# Patient Record
Sex: Female | Born: 1975 | Race: Black or African American | Hispanic: No | Marital: Single
Health system: Midwestern US, Community
[De-identification: ages and names within clinical notes are randomized; demographics above are authoritative.]

## PROBLEM LIST (undated history)

## (undated) DIAGNOSIS — F419 Anxiety disorder, unspecified: Secondary | ICD-10-CM

---

## 1998-09-26 ENCOUNTER — Emergency Department (HOSPITAL_COMMUNITY): Admission: EM | Admit: 1998-09-26 | Discharge: 1998-09-26 | Payer: Self-pay | Admitting: Emergency Medicine

## 1998-09-26 ENCOUNTER — Encounter: Payer: Self-pay | Admitting: Emergency Medicine

## 2015-07-28 ENCOUNTER — Emergency Department (HOSPITAL_COMMUNITY)
Admission: EM | Admit: 2015-07-28 | Discharge: 2015-07-28 | Disposition: A | Discharge status: Home / Self Care | Payer: Self-pay | Attending: Emergency Medicine | Admitting: Emergency Medicine

## 2015-07-28 ENCOUNTER — Encounter (HOSPITAL_COMMUNITY): Payer: Self-pay | Admitting: *Deleted

## 2015-07-28 ENCOUNTER — Emergency Department (HOSPITAL_COMMUNITY): Payer: Self-pay

## 2015-07-28 DIAGNOSIS — W01198A Fall on same level from slipping, tripping and stumbling with subsequent striking against other object, initial encounter: Secondary | ICD-10-CM | POA: Insufficient documentation

## 2015-07-28 DIAGNOSIS — M25512 Pain in left shoulder: Secondary | ICD-10-CM

## 2015-07-28 DIAGNOSIS — S8001XA Contusion of right knee, initial encounter: Secondary | ICD-10-CM | POA: Insufficient documentation

## 2015-07-28 DIAGNOSIS — Y9389 Activity, other specified: Secondary | ICD-10-CM | POA: Insufficient documentation

## 2015-07-28 DIAGNOSIS — Y999 Unspecified external cause status: Secondary | ICD-10-CM | POA: Insufficient documentation

## 2015-07-28 DIAGNOSIS — Y9289 Other specified places as the place of occurrence of the external cause: Secondary | ICD-10-CM | POA: Insufficient documentation

## 2015-07-28 DIAGNOSIS — S4992XA Unspecified injury of left shoulder and upper arm, initial encounter: Secondary | ICD-10-CM | POA: Insufficient documentation

## 2015-07-28 MED ORDER — HYDROCODONE-ACETAMINOPHEN 5-325 MG PO TABS
1.0000 | ORAL_TABLET | Freq: Once | ORAL | Status: AC
  Administered 2015-07-28: 1 via ORAL
  Filled 2015-07-28: qty 1

## 2015-07-28 MED ORDER — NAPROXEN 500 MG PO TABS: 500.0000 mg | ORAL_TABLET | Freq: Two times a day (BID) | ORAL | Status: DC

## 2015-07-28 NOTE — Discharge Instructions (Signed)
Shoulder Pain The shoulder is the joint that connects your arms to your body. The bones that form the shoulder joint include the upper arm bone (humerus), the shoulder blade (scapula), and the collarbone (clavicle). The top of the humerus is shaped like a ball and fits into a rather flat socket on the scapula (glenoid cavity). A combination of muscles and strong, fibrous tissues that connect muscles to bones (tendons) support your shoulder joint and hold the ball in the socket. Small, fluid-filled sacs (bursae) are located in different areas of the joint. They act as cushions between the bones and the overlying soft tissues and help reduce friction between the gliding tendons and the bone as you move your arm. Your shoulder joint allows a wide range of motion in your arm. This range of motion allows you to do things like scratch your back or throw a ball. However, this range of motion also makes your shoulder more prone to pain from overuse and injury. Causes of shoulder pain can originate from both injury and overuse and usually can be grouped in the following four categories:  Redness, swelling, and pain (inflammation) of the tendon (tendinitis) or the bursae (bursitis).  Instability, such as a dislocation of the joint.  Inflammation of the joint (arthritis).  Broken bone (fracture). HOME CARE INSTRUCTIONS   Apply ice to the sore area.  Put ice in a plastic bag.  Place a towel between your skin and the bag.  Leave the ice on for 15-20 minutes, 3-4 times per day for the first 2 days, or as directed by your health care provider.  Stop using cold packs if they do not help with the pain.  If you have a shoulder sling or immobilizer, wear it as long as your caregiver instructs. Only remove it to shower or bathe. Move your arm as little as possible, but keep your hand moving to prevent swelling.  Squeeze a soft ball or foam pad as much as possible to help prevent swelling.  Only take  over-the-counter or prescription medicines for pain, discomfort, or fever as directed by your caregiver. SEEK MEDICAL CARE IF:   Your shoulder pain increases, or new pain develops in your arm, hand, or fingers.  Your hand or fingers become cold and numb.  Your pain is not relieved with medicines. SEEK IMMEDIATE MEDICAL CARE IF:   Your arm, hand, or fingers are numb or tingling.  Your arm, hand, or fingers are significantly swollen or turn white or blue. MAKE SURE YOU:   Understand these instructions.  Will watch your condition.  Will get help right away if you are not doing well or get worse. Document Released: 07/25/2005 Document Revised: 03/01/2014 Document Reviewed: 09/29/2011 [facility] Patient Information 2015 [place], [place]. This information is not intended to replace advice given to you by your health care provider. Make sure you discuss any questions you have with your health care provider. Knee Pain The knee is the complex joint between your thigh and your lower leg. It is made up of bones, tendons, ligaments, and cartilage. The bones that make up the knee are:  The femur in the thigh.  The tibia and fibula in the lower leg.  The patella or kneecap riding in the groove on the lower femur. CAUSES  Knee pain is a common complaint with many causes. A few of these causes are:  Injury, such as:  A ruptured ligament or tendon injury.  Torn cartilage.  Medical conditions, such as:  Gout  Arthritis  Infections  Overuse, over training, or overdoing a physical activity. Knee pain can be minor or severe. Knee pain can accompany debilitating injury. Minor knee problems often respond well to self-care measures or get well on their own. More serious injuries may need medical intervention or even surgery. SYMPTOMS The knee is complex. Symptoms of knee problems can vary widely. Some of the problems are:  Pain with movement and weight bearing.  Swelling and  tenderness.  Buckling of the knee.  Inability to straighten or extend your knee.  Your knee locks and you cannot straighten it.  Warmth and redness with pain and fever.  Deformity or dislocation of the kneecap. DIAGNOSIS  Determining what is wrong may be very straight forward such as when there is an injury. It can also be challenging because of the complexity of the knee. Tests to make a diagnosis may include:  Your caregiver taking a history and doing a physical exam.  Routine X-rays can be used to rule out other problems. X-rays will not reveal a cartilage tear. Some injuries of the knee can be diagnosed by:  Arthroscopy a surgical technique by which a small video camera is inserted through tiny incisions on the sides of the knee. This procedure is used to examine and repair internal knee joint problems. Tiny instruments can be used during arthroscopy to repair the torn knee cartilage (meniscus).  Arthrography is a radiology technique. A contrast liquid is directly injected into the knee joint. Internal structures of the knee joint then become visible on X-ray film.  An MRI scan is a non X-ray radiology procedure in which magnetic fields and a computer produce two- or three-dimensional images of the inside of the knee. Cartilage tears are often visible using an MRI scanner. MRI scans have largely replaced arthrography in diagnosing cartilage tears of the knee.  Blood work.  Examination of the fluid that helps to lubricate the knee joint (synovial fluid). This is done by taking a sample out using a needle and a syringe. TREATMENT The treatment of knee problems depends on the cause. Some of these treatments are:  Depending on the injury, proper casting, splinting, surgery, or physical therapy care will be needed.  Give yourself adequate recovery time. Do not overuse your joints. If you begin to get sore during workout routines, back off. Slow down or do fewer repetitions.  For  repetitive activities such as cycling or running, maintain your strength and nutrition.  Alternate muscle groups. For example, if you are a weight lifter, work the upper body on one day and the lower body the next.  Either tight or weak muscles do not give the proper support for your knee. Tight or weak muscles do not absorb the stress placed on the knee joint. Keep the muscles surrounding the knee strong.  Take care of mechanical problems.  If you have flat feet, orthotics or special shoes may help. See your caregiver if you need help.  Arch supports, sometimes with wedges on the inner or outer aspect of the heel, can help. These can shift pressure away from the side of the knee most bothered by osteoarthritis.  A brace called an "unloader" brace also may be used to help ease the pressure on the most arthritic side of the knee.  If your caregiver has prescribed crutches, braces, wraps or ice, use as directed. The acronym for this is PRICE. This means protection, rest, ice, compression, and elevation.  Nonsteroidal anti-inflammatory drugs (NSAIDs), can help relieve  pain. But if taken immediately after an injury, they may actually increase swelling. Take NSAIDs with food in your stomach. Stop them if you develop stomach problems. Do not take these if you have a history of ulcers, stomach pain, or bleeding from the bowel. Do not take without your caregiver's approval if you have problems with fluid retention, heart failure, or kidney problems.  For ongoing knee problems, physical therapy may be helpful.  Glucosamine and chondroitin are over-the-counter dietary supplements. Both may help relieve the pain of osteoarthritis in the knee. These medicines are different from the usual anti-inflammatory drugs. Glucosamine may decrease the rate of cartilage destruction.  Injections of a corticosteroid drug into your knee joint may help reduce the symptoms of an arthritis flare-up. They may provide pain  relief that lasts a few months. You may have to wait a few months between injections. The injections do have a small increased risk of infection, water retention, and elevated blood sugar levels.  Hyaluronic acid injected into damaged joints may ease pain and provide lubrication. These injections may work by reducing inflammation. A series of shots may give relief for as long as 6 months.  Topical painkillers. Applying certain ointments to your skin may help relieve the pain and stiffness of osteoarthritis. Ask your pharmacist for suggestions. Many over the-counter products are approved for temporary relief of arthritis pain.  In some countries, doctors often prescribe topical NSAIDs for relief of chronic conditions such as arthritis and tendinitis. A review of treatment with NSAID creams found that they worked as well as oral medications but without the serious side effects. PREVENTION  Maintain a healthy weight. Extra pounds put more strain on your joints.  Get strong, stay limber. Weak muscles are a common cause of knee injuries. Stretching is important. Include flexibility exercises in your workouts.  Be smart about exercise. If you have osteoarthritis, chronic knee pain or recurring injuries, you may need to change the way you exercise. This does not mean you have to stop being active. If your knees ache after jogging or playing basketball, consider switching to swimming, water aerobics, or other low-impact activities, at least for a few days a week. Sometimes limiting high-impact activities will provide relief.  Make sure your shoes fit well. Choose footwear that is right for your sport.  Protect your knees. Use the proper gear for knee-sensitive activities. Use kneepads when playing volleyball or laying carpet. Buckle your seat belt every time you drive. Most shattered kneecaps occur in car accidents.  Rest when you are tired. SEEK MEDICAL CARE IF:  You have knee pain that is continual  and does not seem to be getting better.  SEEK IMMEDIATE MEDICAL CARE IF:  Your knee joint feels hot to the touch and you have a high fever. MAKE SURE YOU:   Understand these instructions.  Will watch your condition.  Will get help right away if you are not doing well or get worse. Document Released: 08/12/2007 Document Revised: 01/07/2012 Document Reviewed: 08/12/2007 [facility] Patient Information 2015 [place], [place]. This information is not intended to replace advice given to you by your health care provider. Make sure you discuss any questions you have with your health care provider.

## 2015-07-28 NOTE — ED Notes (Signed)
Pt in stating she slipped in a store tonight and hit her right knee and shin and her left shoulder/neck area, denies hitting head or LOC, ambulatory to room, no distress noted

## 2015-07-28 NOTE — ED Provider Notes (Signed)
CSN: [id number]     Arrival date & time 07/28/15  2115 History  By signing my name below, I, Doreatha Martin, attest that this documentation has been prepared under the direction and in the presence of Felicie Morn, NP.  Electronically Signed: Doreatha Martin, ED Scribe. 07/28/2015. 9:45 PM.    Chief Complaint  Patient presents with  . Fall   Patient is a 39 y.o. female presenting with fall. The history is provided by the patient. No language interpreter was used.  Fall This is a new problem. The current episode started 3 to 5 hours ago. The problem occurs constantly. The problem has not changed since onset.The symptoms are aggravated by walking and standing. The symptoms are relieved by rest. She has tried nothing for the symptoms. The treatment provided no relief.    HPI Comments: Jamie Olson is a 39 y.o. female who presents to the Emergency Department complaining of a mechanical fall that occurred today after slipping in a puddle of water in a store and landing on her right knee and hitting her left shoulder. No head injury, no LOC. She states associated moderate right knee pain that radiates to the ankle, left shoulder pain, that radiates to the neck. Pt reports that pain is worsened with weight bearing, movement and relieved with rest. Pt is not currently followed by a PCP due to a recent move. She denies any other injuries.   History reviewed. No pertinent past medical history. History reviewed. No pertinent past surgical history. History reviewed. No pertinent family history. Social History  Substance Use Topics  . Smoking status: Never Smoker   . Smokeless tobacco: None  . Alcohol Use: None   OB History    No data available     Review of Systems  Musculoskeletal: Positive for arthralgias.  All other systems reviewed and are negative.  Allergies  Review of patient's allergies indicates no known allergies.  Home Medications   Prior to Admission medications   Not on File   BP  152/75 mmHg  Pulse 97  Temp(Src) 98.5 F (36.9 C) (Oral)  Resp 20  SpO2 100%  LMP 07/16/2015 Physical Exam  Constitutional: She is oriented to person, place, and time. She appears well-developed and well-nourished.  HENT:  Head: Normocephalic and atraumatic.  Eyes: Conjunctivae and EOM are normal. Pupils are equal, round, and reactive to light.  Neck: Normal range of motion. Neck supple.  Cardiovascular: Normal rate, regular rhythm and normal heart sounds.   Pulmonary/Chest: Effort normal and breath sounds normal. No respiratory distress.  Lungs CTA bilaterally.   Abdominal: Soft. She exhibits no distension. There is no tenderness.  Musculoskeletal: Normal range of motion. She exhibits tenderness.  Right knee tenderness. No joint instability. Left shoulder muscular pain into the lateral neck. No midpoint cervical tenderness. Equal grip strengths bilaterally.   Neurological: She is alert and oriented to person, place, and time.  Skin: Skin is warm and dry.  Psychiatric: She has a normal mood and affect. Her behavior is normal.  Nursing note and vitals reviewed.   ED Course  Procedures (including critical care time) DIAGNOSTIC STUDIES: Oxygen Saturation is 100% on RA, normal by my interpretation.    COORDINATION OF CARE: 9:42 PM Discussed treatment plan with pt at bedside and pt agreed to plan.   Labs Review Labs Reviewed - No data to display  Imaging Review Dg Knee Complete 4 Views Right  07/28/2015   CLINICAL DATA:  Fall with right knee pain. Initial encounter.  EXAM: RIGHT KNEE - COMPLETE 4+ VIEW  COMPARISON:  None.  FINDINGS: There is no evidence of fracture, dislocation, or joint effusion. There is no evidence of arthropathy or other focal bone abnormality. Soft tissues are unremarkable.  IMPRESSION: Negative.   Electronically Signed   By: Marnee Spring M.D.   On: 07/28/2015 22:13   I have personally reviewed and evaluated these images and lab results as part of my  medical decision-making.   EKG Interpretation None     Radiology results reviewed and shared with patient. MDM   Final diagnoses:  None    Fall. Right knee contusion. Left shoulder pain. Symptomatic care instructions provided. Return precautions discussed.  I personally performed the services described in this documentation, which was scribed in my presence. The recorded information has been reviewed and is accurate.   Felicie Morn, NP 07/28/15 2352  Courteney Randall An, MD 07/29/15 1610

## 2015-07-28 NOTE — ED Notes (Signed)
Pt stable, ambulatory, states understanding of discharge instructions 

## 2016-12-29 IMAGING — CR DG KNEE COMPLETE 4+V*R*
4 series · 4 of 4 positions shown · non-contrast
Comparison: None.

CLINICAL DATA: Fall with right knee pain. Initial encounter.

EXAM:
RIGHT KNEE - COMPLETE 4+ VIEW

[knee ap]
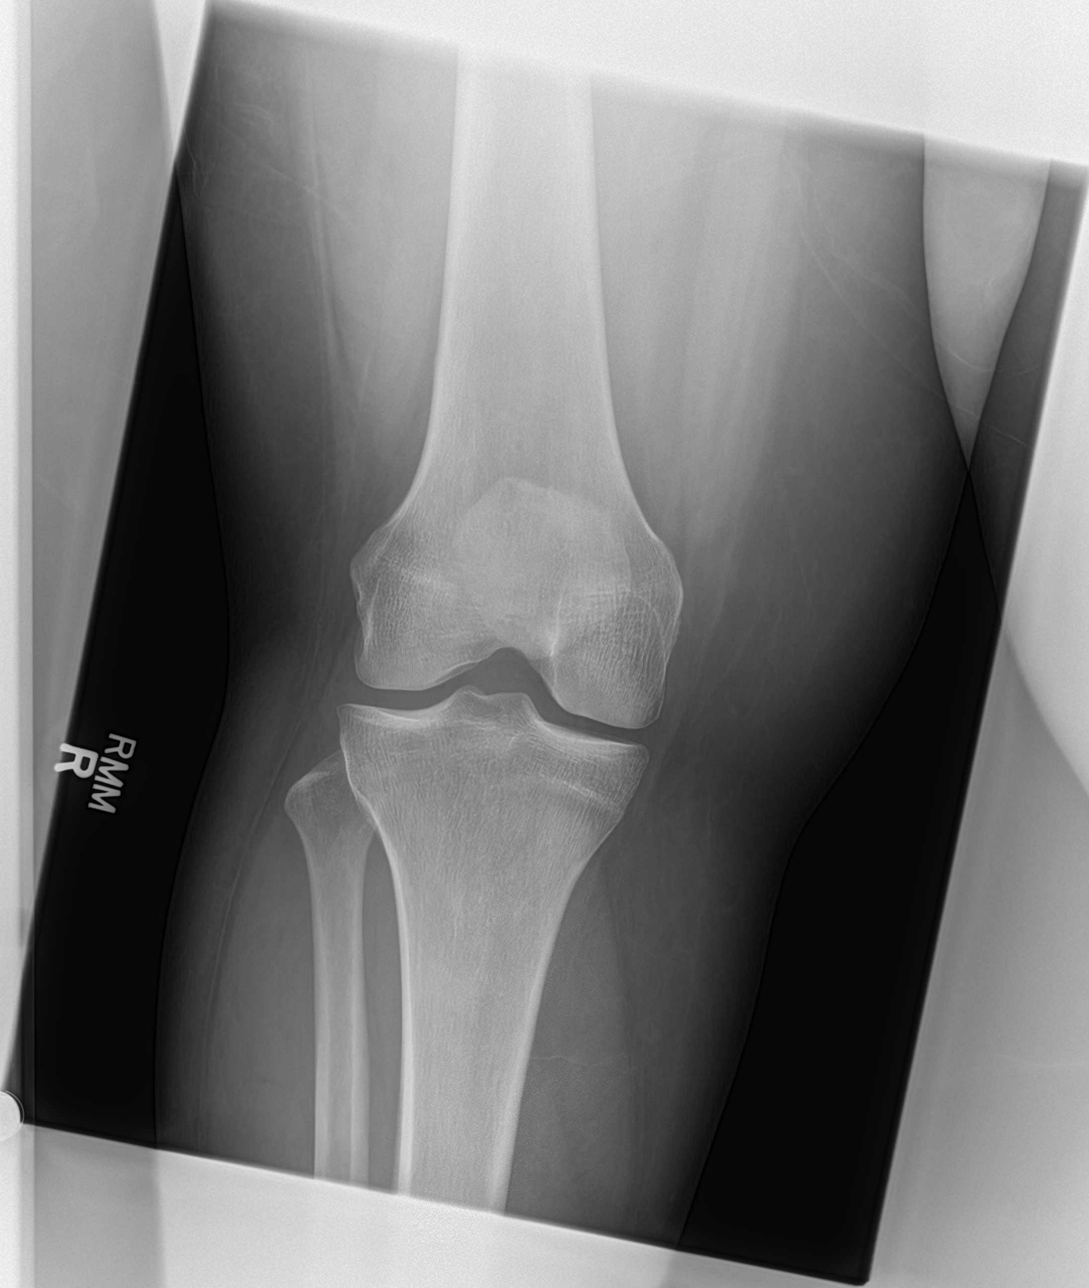

[tunnel]
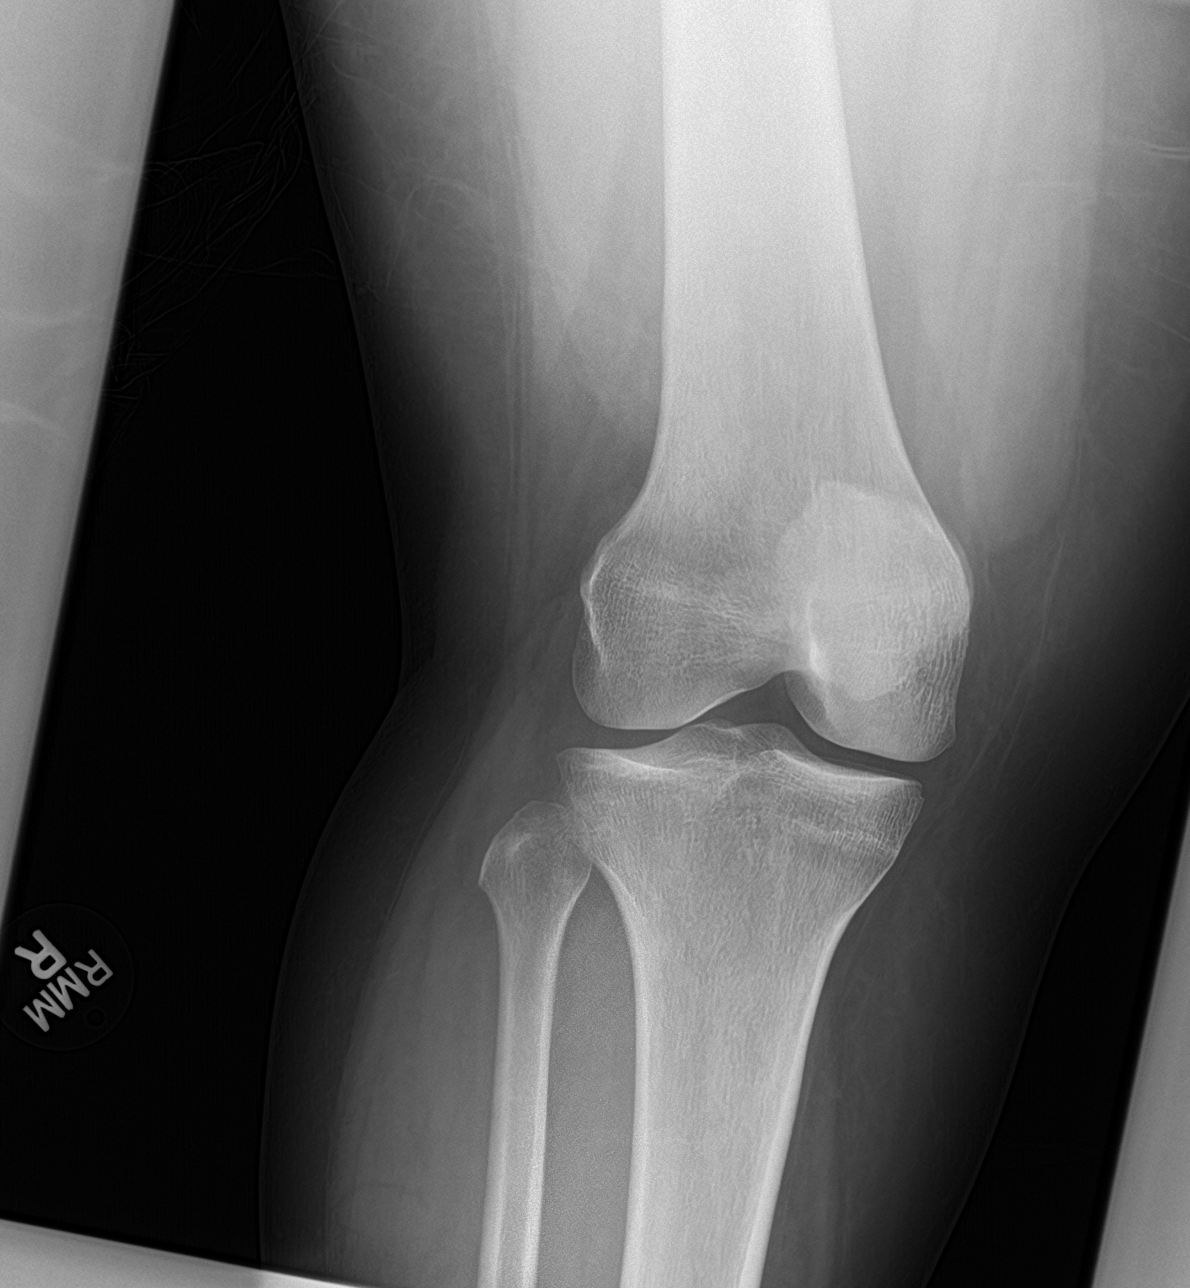

[knee lat]
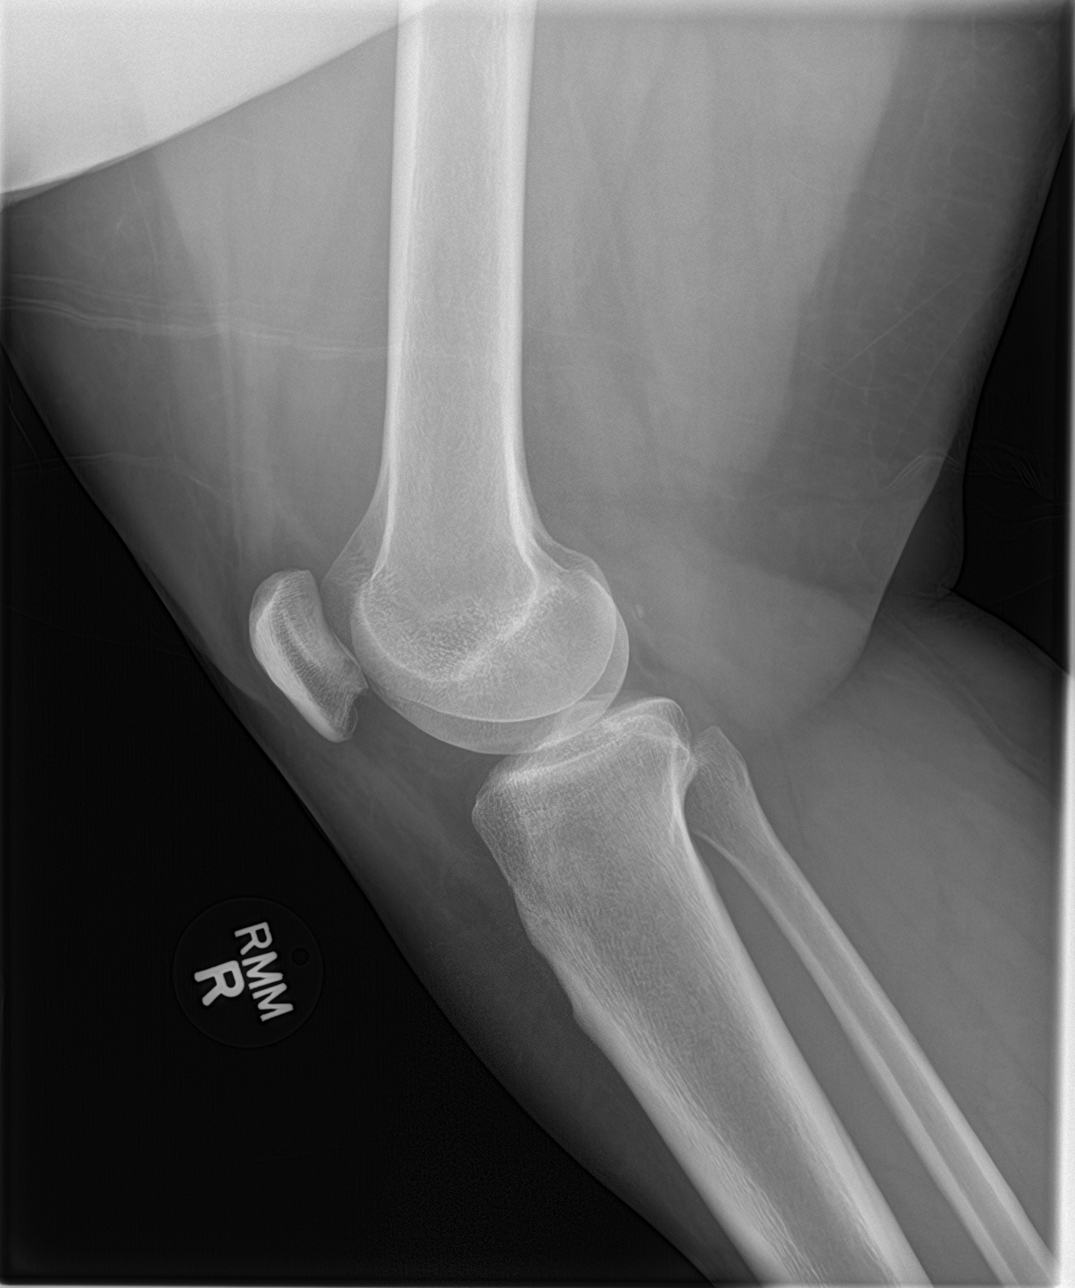

[knee obl]
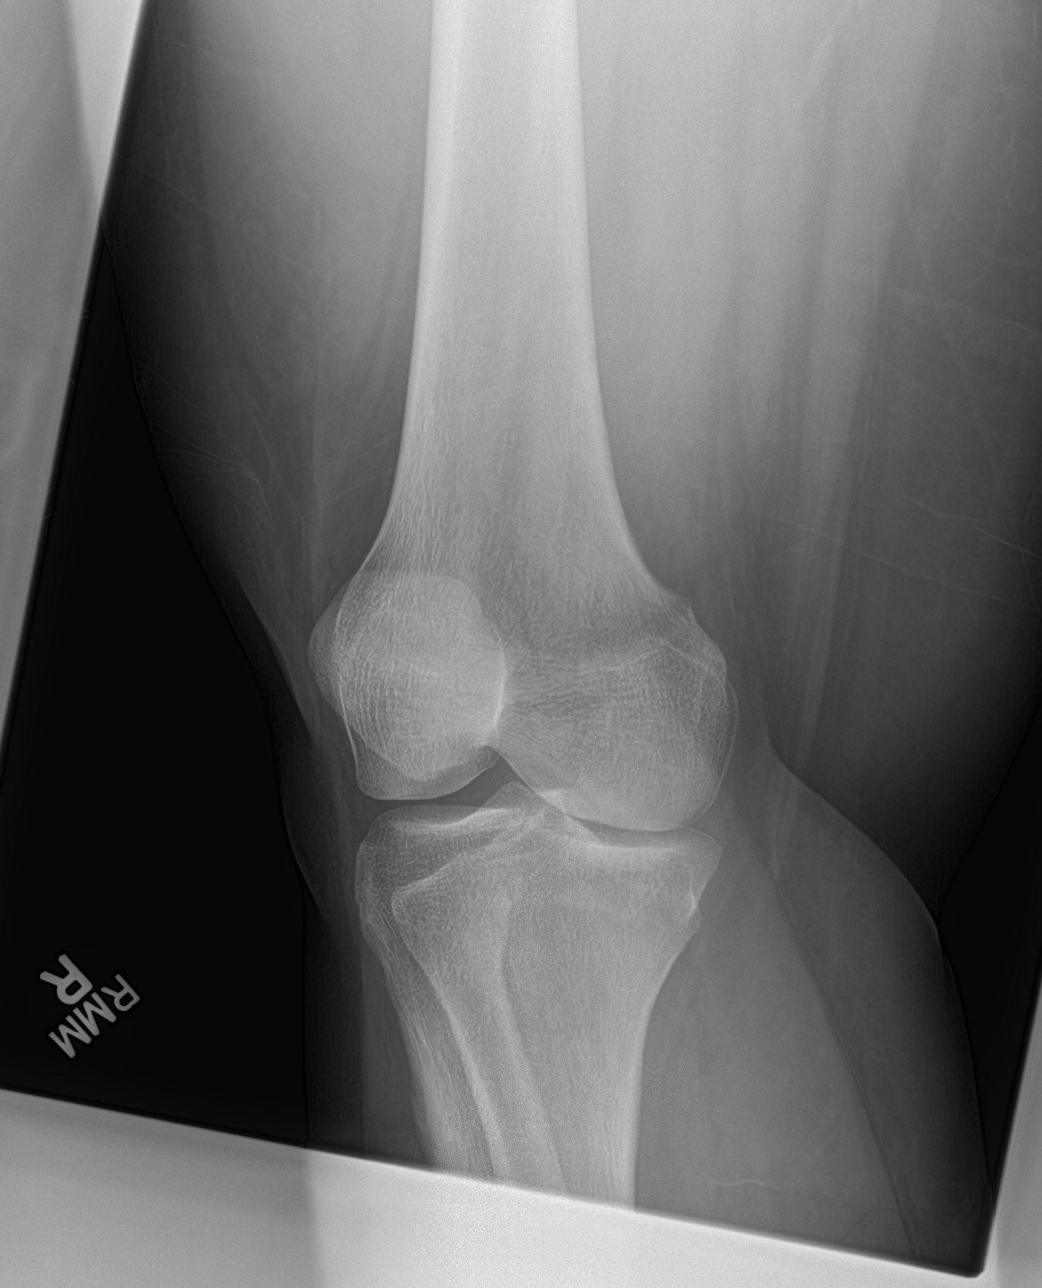

[4 of 4 positions shown; findings below may reference images not displayed]

FINDINGS: There is no evidence of fracture, dislocation, or joint effusion.
There is no evidence of arthropathy or other focal bone abnormality.
Soft tissues are unremarkable.
IMPRESSION: Negative.

## 2019-10-24 ENCOUNTER — Inpatient Hospital Stay
Admit: 2019-10-24 | Discharge: 2019-10-24 | Disposition: A | Discharge status: Home / Self Care | Payer: BLUE CROSS/BLUE SHIELD | Attending: Emergency Medicine

## 2019-10-24 DIAGNOSIS — M5431 Sciatica, right side: Secondary | ICD-10-CM

## 2019-10-24 MED ORDER — DEXAMETHASONE SODIUM PHOSPHATE 4 MG/ML IJ SOLN
4 mg/mL | INTRAMUSCULAR | Status: AC
Start: 2019-10-24 — End: 2019-10-24
  Administered 2019-10-24: 22:00:00 via INTRAMUSCULAR

## 2019-10-24 MED ORDER — CYCLOBENZAPRINE 10 MG TAB
10 mg | ORAL_TABLET | Freq: Three times a day (TID) | ORAL | 0 refills | Status: AC
Start: 2019-10-24 — End: 2021-09-24

## 2019-10-24 MED ORDER — DEXAMETHASONE SODIUM PHOSPHATE (PF) 10 MG/ML INJECTION
10 mg/mL | INTRAMUSCULAR | Status: AC
Start: 2019-10-24 — End: 2019-10-25

## 2019-10-24 MED ORDER — METHYLPREDNISOLONE 4 MG TABS IN A DOSE PACK
4 mg | ORAL | 0 refills | Status: AC
Start: 2019-10-24 — End: 2021-09-24

## 2019-10-24 MED FILL — DEXAMETHASONE SODIUM PHOSPHATE (PF) 10 MG/ML INJECTION: 10 mg/mL | INTRAMUSCULAR | Qty: 4

## 2019-10-24 NOTE — ED Provider Notes (Signed)
ED Provider Notes by Hetty BlendHutchison, Dabria Wadas, MD at 10/24/19 1704                Author: Hetty BlendHutchison, Auron Tadros, MD  Service: Emergency Medicine  Author Type: Physician       Filed: 10/24/19 1727  Date of Service: 10/24/19 1704  Status: Signed          Editor: Hetty BlendHutchison, Shalanda Brogden, MD (Physician)               EMERGENCY DEPARTMENT HISTORY AND PHYSICAL EXAM           Date: 10/24/2019   Patient Name: Leah Black        History of Presenting Illness          Chief Complaint       Patient presents with        ?  Back Pain        ?  Leg Pain           History Provided By: Patient      HPI: Preston Fleetingharlene Roberta Demaria , 43 y.o. female presents to the  ED with complaints of back and leg pain. Pt notes she has had lower back pain that radiates to her R leg that has worsened over the last week. Pt currently rates the pain a 7-8/10 that worsens with standing. Pt denies any injuries or trauma to her back  or leg. Pt denies any other sxs       There are no other complaints, changes, or physical findings at this time.      PCP: Alcario DroughtWinslow, Morgan Elizabeth, PA-C              Past History        Past Medical History:   History reviewed. No pertinent past medical history.      Past Surgical History:     Past Surgical History:         Procedure  Laterality  Date          ?  HX GI               Family History:   History reviewed. No pertinent family history.      Social History:     Social History          Tobacco Use         ?  Smoking status:  Never Smoker     ?  Smokeless tobacco:  Never Used       Substance Use Topics         ?  Alcohol use:  Yes             Comment: socially         ?  Drug use:  Never           Allergies:   Not on File           Review of Systems     Review of Systems    Musculoskeletal: Positive for back pain. Negative for arthralgias, joint swelling, myalgias, neck pain and neck stiffness.         R leg pain            Physical Exam     Physical Exam   Vitals signs and nursing note reviewed.    Constitutional:        General: She is awake.      Appearance: Normal appearance. She is well-developed and well-groomed. She is  obese.      Interventions:  Face mask in place.   HENT :       Head: Normocephalic and atraumatic.   Eyes :       Extraocular Movements: Extraocular movements intact.      Pupils: Pupils are equal, round, and reactive to light.    Neck:       Musculoskeletal: Full passive range of motion without pain, normal range of motion and neck supple.    Cardiovascular:       Rate and Rhythm: Normal rate and regular rhythm.      Heart sounds: Normal heart sounds.    Pulmonary:       Effort: Pulmonary effort is normal.      Breath sounds: Normal breath sounds and  air entry.   Abdominal:      General: Abdomen is flat.      Palpations: Abdomen is soft.     Musculoskeletal:      Lumbar back: She exhibits tenderness.      Comments:  Lower back tenderness, radiating to R leg consistent with sciatica.    Skin:      General: Skin is warm and dry.   Neurological :       General: No focal deficit present.      Mental Status: She is alert and oriented to person, place, and time.    Psychiatric:         Attention and Perception: Attention and perception normal.         Mood and Affect: Mood and affect normal.         Speech: Speech normal.         Behavior:  Behavior normal. Behavior is cooperative.         Thought Content: Thought content normal.         Cognition and Memory: Cognition and memory normal.         Judgment: Judgment normal.               Diagnostic Study Results        Labs -    No results found for this or any previous visit (from the past 12 hour(s)).      Radiologic Studies -      No orders to display          CT Results   (Last 48 hours)          None                 CXR Results   (Last 48 hours)          None                    Medical Decision Making and ED Course     I am the first provider for this patient.      I reviewed the vital signs, available nursing notes, past medical history,  past surgical history, family history and social history.      Vital Signs-Reviewed the patient's vital signs.   Patient Vitals for the past 12 hrs:           Temp  Pulse  Resp  BP           10/24/19 1704  98 ??F (36.7 ??C)  (!) 102  18  (!) 151/88              Records Reviewed: Nursing Notes  ED Course:    Initial assessment performed. The patients presenting problems have been discussed, and they are in agreement with the care plan formulated and outlined with them.  I have encouraged them to ask questions as they arise throughout their visit.      Provider Notes (Medical Decision Making):                  Disposition              DISCHARGE PLAN:   1. There are no discharge medications for this patient.      2.      Follow-up Information               Follow up With  Specialties  Details  Why  Contact Info              Eulis Canner, MD  Orthopedic Surgery    As needed  [address]   [address]   [place] [address]   [phone]                3.  Return to ED if worse         Diagnosis        Clinical Impression:       1.  Sciatica, right side            Attestations:      By signing my name below, I, Hedy Camara, attest that this documentation has been prepared under the direction and in presence of Dr. Will Bonnet  on 10/24/19. Electronically signed: Hedy Camara, 10/24/19 , 5:04 PM            Please note that this dictation was completed with Dragon, the computer voice recognition software.  Quite often unanticipated grammatical, syntax, homophones, and other interpretive errors are inadvertently  transcribed by the computer software.  Please disregard these errors.  Please excuse any errors that have escaped final proofreading.  Thank you.

## 2019-10-24 NOTE — Progress Notes (Signed)
GOING TO A DR IN [place] SHE DID NOT WISH TO MAKE AN APPT AT THIS TIME - ANL 10/26/2019

## 2019-10-24 NOTE — ED Notes (Signed)
C/o pin in right lower back that shoots down right leg [rogressively worse over the past few days

## 2019-10-24 NOTE — ED Triage Notes (Signed)
C/o pin in right lower back that shoots down right leg [rogressively worse over the past few days

## 2019-10-24 NOTE — Progress Notes (Signed)
GOING TO A DR IN [place] SHE DID NOT WISH TO MAKE AN APPT AT THIS TIME - ANL 10/26/2019

## 2019-10-24 NOTE — ED Provider Notes (Signed)
EMERGENCY DEPARTMENT HISTORY AND PHYSICAL EXAM      Date: 10/24/2019  Patient Name: Leah Black    History of Presenting Illness     Chief Complaint   Patient presents with   ??? Back Pain   ??? Leg Pain       History Provided By: Patient    HPI: Preston Fleeting, 43 y.o. female presents to the ED with complaints of back and leg pain. Pt notes she has had lower back pain that radiates to her R leg that has worsened over the last week. Pt currently rates the pain a 7-8/10 that worsens with standing. Pt denies any injuries or trauma to her back or leg. Pt denies any other sxs     There are no other complaints, changes, or physical findings at this time.    PCP: Alcario Drought, PA-C        Past History     Past Medical History:  History reviewed. No pertinent past medical history.    Past Surgical History:  Past Surgical History:   Procedure Laterality Date   ??? HX GI         Family History:  History reviewed. No pertinent family history.    Social History:  Social History     Tobacco Use   ??? Smoking status: Never Smoker   ??? Smokeless tobacco: Never Used   Substance Use Topics   ??? Alcohol use: Yes     Comment: socially   ??? Drug use: Never       Allergies:  Not on File      Review of Systems   Review of Systems   Musculoskeletal: Positive for back pain. Negative for arthralgias, joint swelling, myalgias, neck pain and neck stiffness.        R leg pain       Physical Exam   Physical Exam  Vitals signs and nursing note reviewed.   Constitutional:       General: She is awake.      Appearance: Normal appearance. She is well-developed and well-groomed. She is obese.      Interventions: Face mask in place.   HENT:      Head: Normocephalic and atraumatic.   Eyes:      Extraocular Movements: Extraocular movements intact.      Pupils: Pupils are equal, round, and reactive to light.   Neck:      Musculoskeletal: Full passive range of motion without pain, normal range of motion and neck supple.    Cardiovascular:      Rate and Rhythm: Normal rate and regular rhythm.      Heart sounds: Normal heart sounds.   Pulmonary:      Effort: Pulmonary effort is normal.      Breath sounds: Normal breath sounds and air entry.   Abdominal:      General: Abdomen is flat.      Palpations: Abdomen is soft.   Musculoskeletal:      Lumbar back: She exhibits tenderness.      Comments: Lower back tenderness, radiating to R leg consistent with sciatica.   Skin:     General: Skin is warm and dry.   Neurological:      General: No focal deficit present.      Mental Status: She is alert and oriented to person, place, and time.   Psychiatric:         Attention and Perception: Attention and perception normal.  Mood and Affect: Mood and affect normal.         Speech: Speech normal.         Behavior: Behavior normal. Behavior is cooperative.         Thought Content: Thought content normal.         Cognition and Memory: Cognition and memory normal.         Judgment: Judgment normal.         Diagnostic Study Results     Labs -   No results found for this or any previous visit (from the past 12 hour(s)).    Radiologic Studies -   No orders to display     CT Results  (Last 48 hours)    None        CXR Results  (Last 48 hours)    None          Medical Decision Making and ED Course   I am the first provider for this patient.    I reviewed the vital signs, available nursing notes, past medical history, past surgical history, family history and social history.    Vital Signs-Reviewed the patient's vital signs.  Patient Vitals for the past 12 hrs:   Temp Pulse Resp BP   10/24/19 1704 98 ??F (36.7 ??C) (!) 102 18 (!) 151/88         Records Reviewed: Nursing Notes      ED Course:   Initial assessment performed. The patients presenting problems have been discussed, and they are in agreement with the care plan formulated and outlined with them.  I have encouraged them to ask questions as they arise throughout their visit.     Provider Notes (Medical Decision Making):           Disposition         DISCHARGE PLAN:  1. There are no discharge medications for this patient.    2.   Follow-up Information     Follow up With Specialties Details Why Contact Info    Derry Skill, MD Orthopedic Surgery  As needed [address]  [address]  [place] [address]  [phone]          3.  Return to ED if worse     Diagnosis     Clinical Impression:   1. Sciatica, right side        Attestations:    By signing my name below, I, Steele Berg, attest that this documentation has been prepared under the direction and in presence of Dr. Ihor Dow on 10/24/19. Electronically signed: Steele Berg, 10/24/19, 5:04 PM        Please note that this dictation was completed with Dragon, the computer voice recognition software.  Quite often unanticipated grammatical, syntax, homophones, and other interpretive errors are inadvertently transcribed by the computer software.  Please disregard these errors.  Please excuse any errors that have escaped final proofreading.  Thank you.

## 2021-04-09 ENCOUNTER — Other Ambulatory Visit: Payer: Self-pay

## 2021-04-09 ENCOUNTER — Encounter (HOSPITAL_COMMUNITY): Payer: Self-pay | Admitting: Emergency Medicine

## 2021-04-09 ENCOUNTER — Ambulatory Visit (HOSPITAL_COMMUNITY)
Admission: EM | Admit: 2021-04-09 | Discharge: 2021-04-09 | Disposition: A | Discharge status: Home / Self Care | Payer: BC Managed Care – PPO | Attending: Urgent Care | Admitting: Urgent Care

## 2021-04-09 DIAGNOSIS — H1013 Acute atopic conjunctivitis, bilateral: Secondary | ICD-10-CM | POA: Insufficient documentation

## 2021-04-09 DIAGNOSIS — J029 Acute pharyngitis, unspecified: Secondary | ICD-10-CM | POA: Insufficient documentation

## 2021-04-09 DIAGNOSIS — R0789 Other chest pain: Secondary | ICD-10-CM | POA: Insufficient documentation

## 2021-04-09 DIAGNOSIS — B9789 Other viral agents as the cause of diseases classified elsewhere: Secondary | ICD-10-CM | POA: Insufficient documentation

## 2021-04-09 DIAGNOSIS — Z20822 Contact with and (suspected) exposure to covid-19: Secondary | ICD-10-CM | POA: Diagnosis not present

## 2021-04-09 DIAGNOSIS — J988 Other specified respiratory disorders: Secondary | ICD-10-CM | POA: Diagnosis not present

## 2021-04-09 MED ORDER — AZELASTINE HCL 0.05 % OP SOLN: 1.0000 [drp] | Freq: Two times a day (BID) | OPHTHALMIC | 0 refills | Status: DC

## 2021-04-09 MED ORDER — FLUTICASONE PROPIONATE 50 MCG/ACT NA SUSP: 2.0000 | Freq: Every day | NASAL | 0 refills | Status: DC

## 2021-04-09 MED ORDER — CETIRIZINE HCL 10 MG PO TABS: 10.0000 mg | ORAL_TABLET | Freq: Every day | ORAL | 0 refills | Status: DC

## 2021-04-09 NOTE — ED Provider Notes (Signed)
[facility]   MRN: [id number] DOB: 1976-01-10  Subjective:   Jamie Olson is a 45 y.o. female presenting for 1 week history of persistent right eye redness and drainage.  Has started to have a little bit more irritation in the past couple of days.  Has been wearing cosmetic eyelashes consistently.  She also had chest pain with a cough 3 days ago.  Still has throat pain.  Had multiple sick contacts.  Thinks he had RSV.  Would like to make sure she does not have COVID-19.  She is COVID vaccinated but no booster.  Denies any active cough, chest pain, shortness of breath, body aches, fevers.  No photophobia, eye trauma, contact lens wear.  Denies smoking cigarettes.  No history of respiratory disorders.  No current facility-administered medications for this encounter.  Current Outpatient Medications:    naproxen (NAPROSYN) 500 MG tablet, Take 1 tablet (500 mg total) by mouth 2 (two) times daily., Disp: 20 tablet, Rfl: 0   No Known Allergies  History reviewed. No pertinent past medical history.   History reviewed. No pertinent surgical history.  History reviewed. No pertinent family history.  Social History   Tobacco Use   Smoking status: Never   Smokeless tobacco: Never  Substance Use Topics   Alcohol use: Yes   Drug use: Never    ROS   Objective:   Vitals: BP (!) 160/109 (BP Location: Right Arm)   Pulse 99   Temp 99 F (37.2 C) (Oral)   Resp 17   LMP 03/08/2021   SpO2 96%   Physical Exam Constitutional:      General: She is not in acute distress.    Appearance: Normal appearance. She is well-developed. She is not ill-appearing, toxic-appearing or diaphoretic.  HENT:     Head: Normocephalic and atraumatic.     Right Ear: Tympanic membrane and ear canal normal. No drainage or tenderness. No middle ear effusion. Tympanic membrane is not erythematous.     Left Ear: Tympanic membrane and ear canal normal. No drainage or tenderness.  No middle ear  effusion. Tympanic membrane is not erythematous.     Nose: Nose normal. No congestion or rhinorrhea.     Mouth/Throat:     Mouth: Mucous membranes are moist. No oral lesions.     Pharynx: No pharyngeal swelling, oropharyngeal exudate, posterior oropharyngeal erythema or uvula swelling.     Tonsils: No tonsillar exudate or tonsillar abscesses.     Comments: Thick wall of white post-nasal drainage overlying pharynx.  Eyes:     General: No scleral icterus.       Right eye: No discharge.        Left eye: No discharge.     Extraocular Movements: Extraocular movements intact.     Right eye: Normal extraocular motion.     Left eye: Normal extraocular motion.     Pupils: Pupils are equal, round, and reactive to light.     Comments: Conjunctive a slightly injected bilaterally.  No photophobia.  Cardiovascular:     Rate and Rhythm: Normal rate and regular rhythm.     Pulses: Normal pulses.     Heart sounds: Normal heart sounds. No murmur heard.   No friction rub. No gallop.  Pulmonary:     Effort: Pulmonary effort is normal. No respiratory distress.     Breath sounds: Normal breath sounds. No stridor. No wheezing, rhonchi or rales.  Musculoskeletal:     Cervical back: Normal range of  motion and neck supple.  Lymphadenopathy:     Cervical: No cervical adenopathy.  Skin:    General: Skin is warm and dry.     Findings: No rash.  Neurological:     General: No focal deficit present.     Mental Status: She is alert and oriented to person, place, and time.  Psychiatric:        Mood and Affect: Mood normal.        Behavior: Behavior normal.        Thought Content: Thought content normal.     Assessment and Plan :   PDMP not reviewed this encounter.  1. Viral respiratory illness   2. Sore throat   3. Allergic conjunctivitis of both eyes   4. Atypical chest pain     Will manage for viral illness such as viral URI, viral syndrome, viral rhinitis, COVID-19. Counseled patient on nature of  COVID-19 including modes of transmission, diagnostic testing, management and supportive care.  Offered scripts for symptomatic relief. COVID 19 testing is pending.  Recommended avoidance of cosmetic eyelashes as a suspect she is having allergic conjunctivitis due to this.  Use azelastine eyedrops.  Counseled patient on potential for adverse effects with medications prescribed/recommended today, ER and return-to-clinic precautions discussed, patient verbalized understanding.     Wallis Bamberg, PA-C 04/09/21 1031

## 2021-04-09 NOTE — Discharge Instructions (Addendum)
We will notify you of your COVID-19 test results as they arrive and may take between 24 to 48 hours.  I encourage you to sign up for MyChart if you have not already done so as this can be the easiest way for [place] to communicate results to you online or through a phone app.  In the meantime, if you develop worsening symptoms including fever, chest pain, shortness of breath despite our current treatment plan then please report to the emergency room as this may be a sign of worsening status from possible COVID-19 infection.  Otherwise, we will manage this as a viral syndrome. For sore throat or cough try using a honey-based tea. Use 3 teaspoons of honey with juice squeezed from half lemon. Place shaved pieces of ginger into 1/2-1 cup of water and warm over stove top. Then mix the ingredients and repeat every 4 hours as needed. Please take Tylenol 500mg -650mg  every 6 hours for aches and pains, fevers. Hydrate very well with at least 2 liters of water. Eat light meals such as soups to replenish electrolytes and soft fruits, veggies. Start an antihistamine like Zyrtec for postnasal drainage, sinus congestion.  We'll also be using Flonase for the same symptoms.

## 2021-04-09 NOTE — ED Triage Notes (Signed)
Pt presents with sore throat xs 7 days and right eye redness, drainage and pain xs 2 days.

## 2021-04-10 LAB — SARS CORONAVIRUS 2 (TAT 6-24 HRS): SARS Coronavirus 2: NEGATIVE

## 2021-09-24 ENCOUNTER — Inpatient Hospital Stay
Admit: 2021-09-24 | Discharge: 2021-09-24 | Disposition: A | Discharge status: Home / Self Care | Payer: BLUE CROSS/BLUE SHIELD | Attending: Emergency Medicine

## 2021-09-24 DIAGNOSIS — J069 Acute upper respiratory infection, unspecified: Secondary | ICD-10-CM

## 2021-09-24 MED ORDER — BENZONATATE 100 MG CAP
100 mg | ORAL_CAPSULE | Freq: Three times a day (TID) | ORAL | 0 refills | Status: AC | PRN
Start: 2021-09-24 — End: 2021-10-01

## 2021-09-24 MED ORDER — IBUPROFEN 600 MG TAB
600 mg | ORAL_TABLET | Freq: Four times a day (QID) | ORAL | 0 refills | Status: AC | PRN
Start: 2021-09-24 — End: ?

## 2021-09-24 NOTE — ED Provider Notes (Signed)
Pt c/o cough/congestion x 2 days.  No fever. No pain. No n/v/d. No weakness. No back pain or urinary changes.   Cough mild, non prod.  Taking mucinex w mild relief.  Neg home covid test but was an old test per pt   No sore throat or diff swallwing. No sob.        History reviewed. No pertinent past medical history.    Past Surgical History:   Procedure Laterality Date    HX APPENDECTOMY      HX GI           History reviewed. No pertinent family history.    Social History     Socioeconomic History    Marital status: SINGLE     Spouse name: Not on file    Number of children: Not on file    Years of education: Not on file    Highest education level: Not on file   Occupational History    Not on file   Tobacco Use    Smoking status: Never    Smokeless tobacco: Never   Substance and Sexual Activity    Alcohol use: Yes     Comment: socially    Drug use: Never    Sexual activity: Not on file   Other Topics Concern    Not on file   Social History Narrative    Not on file     Social Determinants of Health     Financial Resource Strain: Not on file   Food Insecurity: Not on file   Transportation Needs: Not on file   Physical Activity: Not on file   Stress: Not on file   Social Connections: Not on file   Intimate Partner Violence: Not on file   Housing Stability: Not on file         ALLERGIES: Patient has no known allergies.    Review of Systems   Constitutional:  Negative for fever.   HENT:  Positive for congestion.    Respiratory:  Positive for cough. Negative for shortness of breath.    Cardiovascular:  Negative for chest pain.   Gastrointestinal:  Negative for abdominal pain and vomiting.   Musculoskeletal:  Negative for back pain.   Skin:  Negative for rash.   Neurological:  Negative for light-headedness.   All other systems reviewed and are negative.    Vitals:    09/24/21 1548   BP: (!) 154/97   Pulse: 96   Resp: 16   Temp: 98.5 ??F (36.9 ??C)   SpO2: 98%   Weight: 108.9 kg (240 lb)   Height: _0  (1.626 m)             Physical Exam  Vitals and nursing note reviewed.   Constitutional:       Appearance: She is well-developed. She is not diaphoretic.   HENT:      Head: Normocephalic and atraumatic.      Nose: Nose normal.   Eyes:      Pupils: Pupils are equal, round, and reactive to light.   Cardiovascular:      Rate and Rhythm: Normal rate and regular rhythm.      Heart sounds: No murmur heard.  Pulmonary:      Effort: Pulmonary effort is normal.      Breath sounds: No wheezing.   Abdominal:      Palpations: Abdomen is soft.      Tenderness: There is no abdominal tenderness.   Musculoskeletal:  General: No tenderness.      Cervical back: Normal range of motion.   Skin:     General: Skin is dry.      Capillary Refill: Capillary refill takes less than 2 seconds.      Findings: No rash.   Neurological:      Mental Status: She is alert and oriented to person, place, and time.   Psychiatric:         Mood and Affect: Mood normal.        MDM         Procedures    Vitals:  Patient Vitals for the past 12 hrs:   Temp Pulse Resp BP SpO2   09/24/21 1548 98.5 ??F (36.9 ??C) 96 16 (!) 154/97 98 %         Medications ordered:   Medications - No data to display      Lab findings:  No results found for this or any previous visit (from the past 12 hour(s)).        X-Ray, CT or other radiology findings or impressions:  No orders to display             Progress notes, Consult notes or additional Procedure notes:   Alert, non-toxic, lungs ctab, nl sats.   Not c/w hypoxia/sepsis/pna. No risks for inf tx.  No ind for flu testing.  Pt req work note, given. Dc w detailed ret inst given.       Diagnosis:   1. Upper respiratory tract infection, unspecified type        Disposition: home    Follow-up Information       Follow up With Specialties Details Why Contact Info    [facility] [place] EMERGENCY DEPT Emergency Medicine Go to  As needed, If symptoms worsen [place]    Arlana Lindau, MD Family Medicine Schedule an  appointment as soon as possible for a visit in 2 days  [place] [address]  [phone]      Rosalene Billings, NP Nurse Practitioner Schedule an appointment as soon as possible for a visit in 2 days  [place] [address]  [phone]               Patient's Medications   Start Taking    BENZONATATE (TESSALON PERLES) 100 MG CAPSULE    Take 1 Capsule by mouth three (3) times daily as needed for Cough for up to 7 days.    IBUPROFEN (MOTRIN) 600 MG TABLET    Take 1 Tablet by mouth every six (6) hours as needed for Pain.   Continue Taking    CETIRIZINE (ZYRTEC) 10 MG CAP    Take  by mouth.   These Medications have changed    No medications on file   Stop Taking    CYCLOBENZAPRINE (FLEXERIL) 10 MG TABLET    Take 1 Tab by mouth three (3) times daily (with meals).    METHYLPREDNISOLONE (MEDROL, PAK,) 4 MG TABLET    Take as directed.    OMEPRAZOLE (PRILOSEC) 20 MG CAPSULE    Take 20 mg by mouth.

## 2021-09-24 NOTE — ED Notes (Signed)
Patient c/o nasal congestion, cough, and chest congestion since Friday.

## 2021-09-24 NOTE — ED Notes (Signed)
Discharge instructions reviewed with patient.  Patient verbalized understanding.  Patient advised to follow up as directed on discharge instructions.  Patient denies questions, needs or concerns at this time.  Patient verbalized understanding. No s/sx of distress noted.

## 2023-05-29 ENCOUNTER — Encounter (HOSPITAL_COMMUNITY): Payer: Self-pay | Admitting: Emergency Medicine

## 2023-05-29 ENCOUNTER — Emergency Department (HOSPITAL_COMMUNITY)
Admission: EM | Admit: 2023-05-29 | Discharge: 2023-05-30 | Disposition: A | Discharge status: Home / Self Care | Payer: BC Managed Care – PPO | Source: Home / Self Care

## 2023-05-29 ENCOUNTER — Other Ambulatory Visit: Payer: Self-pay

## 2023-05-29 DIAGNOSIS — R45851 Suicidal ideations: Secondary | ICD-10-CM | POA: Insufficient documentation

## 2023-05-29 DIAGNOSIS — Z79899 Other long term (current) drug therapy: Secondary | ICD-10-CM | POA: Insufficient documentation

## 2023-05-29 DIAGNOSIS — F332 Major depressive disorder, recurrent severe without psychotic features: Secondary | ICD-10-CM | POA: Insufficient documentation

## 2023-05-29 DIAGNOSIS — F322 Major depressive disorder, single episode, severe without psychotic features: Secondary | ICD-10-CM | POA: Diagnosis not present

## 2023-05-29 HISTORY — DX: Anxiety disorder, unspecified: F41.9

## 2023-05-29 LAB — COMPREHENSIVE METABOLIC PANEL
ALT: 18 U/L (ref 0–44)
AST: 21 U/L (ref 15–41)
Albumin: 4.1 g/dL (ref 3.5–5.0)
Alkaline Phosphatase: 79 U/L (ref 38–126)
Anion gap: 10 (ref 5–15)
BUN: 9 mg/dL (ref 6–20)
CO2: 26 mmol/L (ref 22–32)
Calcium: 9.1 mg/dL (ref 8.9–10.3)
Chloride: 103 mmol/L (ref 98–111)
Creatinine, Ser: 0.91 mg/dL (ref 0.44–1.00)
GFR, Estimated: >60 mL/min (ref >60)
Glucose, Bld: 98 mg/dL (ref 70–99)
Potassium: 3.9 mmol/L (ref 3.5–5.1)
Sodium: 139 mmol/L (ref 135–145)
Total Bilirubin: 0.8 mg/dL (ref 0.3–1.2)
Total Protein: 7.9 g/dL (ref 6.5–8.1)

## 2023-05-29 LAB — CBC
HCT: 36.8 % (ref 36.0–46.0)
Hemoglobin: 12.1 g/dL (ref 12.0–15.0)
MCH: 30.6 pg (ref 26.0–34.0)
MCHC: 32.9 g/dL (ref 30.0–36.0)
MCV: 92.9 fL (ref 80.0–100.0)
Platelets: 292 10*3/uL (ref 150–400)
RBC: 3.96 MIL/uL (ref 3.87–5.11)
RDW: 13 % (ref 11.5–15.5)
WBC: 7.2 10*3/uL (ref 4.0–10.5)
nRBC: 0 % (ref 0.0–0.2)

## 2023-05-29 LAB — RAPID URINE DRUG SCREEN, HOSP PERFORMED
Amphetamines: NOT DETECTED
Barbiturates: NOT DETECTED
Benzodiazepines: NOT DETECTED
Cocaine: NOT DETECTED
Opiates: NOT DETECTED
Tetrahydrocannabinol: NOT DETECTED

## 2023-05-29 LAB — ETHANOL: Alcohol, Ethyl (B): 86 mg/dL — ABNORMAL HIGH (ref <10)

## 2023-05-29 LAB — SALICYLATE LEVEL: Salicylate Lvl: <7 mg/dL — ABNORMAL LOW (ref 7.0–30.0)

## 2023-05-29 LAB — ACETAMINOPHEN LEVEL: Acetaminophen (Tylenol), Serum: <10 ug/mL — ABNORMAL LOW (ref 10–30)

## 2023-05-29 LAB — POC URINE PREG, ED: Preg Test, Ur: NEGATIVE

## 2023-05-29 MED ORDER — ONDANSETRON HCL 4 MG PO TABS: 4.0000 mg | ORAL_TABLET | Freq: Three times a day (TID) | ORAL | Status: DC | PRN

## 2023-05-29 MED ORDER — ACETAMINOPHEN 325 MG PO TABS: 650.0000 mg | ORAL_TABLET | ORAL | Status: DC | PRN

## 2023-05-29 MED ORDER — ZOLPIDEM TARTRATE 5 MG PO TABS: 5.0000 mg | ORAL_TABLET | Freq: Every evening | ORAL | Status: DC | PRN

## 2023-05-29 NOTE — ED Triage Notes (Addendum)
Pt bib Daughter for SI. Pt states she found out today she had herpes. Per daughter pt has been having suicidal thoughts today . Daughter said pt said she was going to take pills and tried to jump out of the car on the way to the hospital. Pt states she was not going to take pills and didn't try to jump out of the car. Pt denies any SI history until today. She found out recently her long time boyfriend had been with someone who had herpes so she went to be tested. Pt is tearful in triage. Pt also complains of back pain. Pt states she thinks she needs help.

## 2023-05-29 NOTE — ED Notes (Signed)
Patient dressed in burgundy scrubs.

## 2023-05-29 NOTE — ED Provider Notes (Signed)
[place] EMERGENCY DEPARTMENT AT [facility] Provider Note   CSN: [id number] Arrival date & time: 05/29/23  2222     History  Chief Complaint  Patient presents with   Suicidal    Jamie Olson is a 47 y.o. female.  Patient presents to the emergency department for evaluation of depression with suicidal ideation.  Patient reports that she has multiple social stressors that have caused her to feel depressed.  She has had thoughts about harming herself by taking pills.  She reportedly tried to jump from a moving car on the way to the hospital today.       Home Medications Prior to Admission medications   Medication Sig Start Date End Date Taking? Authorizing Provider  azelastine (OPTIVAR) 0.05 % ophthalmic solution Place 1 drop into both eyes 2 (two) times daily. 04/09/21   Wallis Bamberg, PA-C  cetirizine (ZYRTEC ALLERGY) 10 MG tablet Take 1 tablet (10 mg total) by mouth daily. 04/09/21   Wallis Bamberg, PA-C  fluticasone (FLONASE) 50 MCG/ACT nasal spray Place 2 sprays into both nostrils daily. 04/09/21   Wallis Bamberg, PA-C  naproxen (NAPROSYN) 500 MG tablet Take 1 tablet (500 mg total) by mouth 2 (two) times daily. 07/28/15   Felicie Morn, NP      Allergies    Patient has no known allergies.    Review of Systems   Review of Systems  Physical Exam Updated Vital Signs BP (!) 158/104   Pulse (!) 120   Temp 98.9 F (37.2 C) (Oral)   Resp 17   Ht 5\' 4"  (1.626 m)   Wt 92.5 kg   SpO2 100%   BMI 35.02 kg/m  Physical Exam Vitals and nursing note reviewed.  Constitutional:      General: She is not in acute distress.    Appearance: She is well-developed.  HENT:     Head: Normocephalic and atraumatic.     Mouth/Throat:     Mouth: Mucous membranes are moist.  Eyes:     General: Vision grossly intact. Gaze aligned appropriately.     Extraocular Movements: Extraocular movements intact.     Conjunctiva/sclera: Conjunctivae normal.  Cardiovascular:     Rate and  Rhythm: Normal rate and regular rhythm.     Pulses: Normal pulses.     Heart sounds: Normal heart sounds, S1 normal and S2 normal. No murmur heard.    No friction rub. No gallop.  Pulmonary:     Effort: Pulmonary effort is normal. No respiratory distress.     Breath sounds: Normal breath sounds.  Abdominal:     General: Bowel sounds are normal.     Palpations: Abdomen is soft.     Tenderness: There is no abdominal tenderness. There is no guarding or rebound.     Hernia: No hernia is present.  Musculoskeletal:        General: No swelling.     Cervical back: Full passive range of motion without pain, normal range of motion and neck supple. No spinous process tenderness or muscular tenderness. Normal range of motion.     Right lower leg: No edema.     Left lower leg: No edema.  Skin:    General: Skin is warm and dry.     Capillary Refill: Capillary refill takes less than 2 seconds.     Findings: No ecchymosis, erythema, rash or wound.  Neurological:     General: No focal deficit present.     Mental Status: She is alert  and oriented to person, place, and time.     GCS: GCS eye subscore is 4. GCS verbal subscore is 5. GCS motor subscore is 6.     Cranial Nerves: Cranial nerves 2-12 are intact.     Sensory: Sensation is intact.     Motor: Motor function is intact.     Coordination: Coordination is intact.  Psychiatric:        Attention and Perception: She is inattentive.        Mood and Affect: Mood is depressed.        Speech: She is noncommunicative.        Behavior: Behavior is slowed and withdrawn.        Thought Content: Thought content includes suicidal ideation.     ED Results / Procedures / Treatments   Labs (all labs ordered are listed, but only abnormal results are displayed) Labs Reviewed  CBC  COMPREHENSIVE METABOLIC PANEL  ETHANOL  SALICYLATE LEVEL  ACETAMINOPHEN LEVEL  RAPID URINE DRUG SCREEN, HOSP PERFORMED  POC URINE PREG, ED    EKG None  Radiology No  results found.  Procedures Procedures    Medications Ordered in ED Medications  acetaminophen (TYLENOL) tablet 650 mg (has no administration in time range)  zolpidem (AMBIEN) tablet 5 mg (has no administration in time range)  ondansetron (ZOFRAN) tablet 4 mg (has no administration in time range)    ED Course/ Medical Decision Making/ A&P                                 Medical Decision Making Amount and/or Complexity of Data Reviewed Labs: ordered.  Risk OTC drugs. Prescription drug management.   Patient presents to the emergency department for evaluation of suicidal ideation secondary to multiple social stressors.  No history of depression.  Patient has no other past medical history, medically clear for psychiatric evaluation and treatment.        Final Clinical Impression(s) / ED Diagnoses Final diagnoses:  Suicidal ideation    Rx / DC Orders ED Discharge Orders     None         Fanchon Papania, Canary Brim, MD 05/29/23 2319

## 2023-05-30 ENCOUNTER — Encounter (HOSPITAL_COMMUNITY): Payer: Self-pay | Admitting: Nurse Practitioner

## 2023-05-30 ENCOUNTER — Inpatient Hospital Stay (HOSPITAL_COMMUNITY)
Admission: AD | Admit: 2023-05-30 | Discharge: 2023-06-03 | DRG: 885 | Disposition: A | Discharge status: Home / Self Care | Payer: BC Managed Care – PPO | Source: Intra-hospital | Attending: Psychiatry | Admitting: Psychiatry

## 2023-05-30 DIAGNOSIS — A6009 Herpesviral infection of other urogenital tract: Secondary | ICD-10-CM | POA: Diagnosis present

## 2023-05-30 DIAGNOSIS — R45851 Suicidal ideations: Secondary | ICD-10-CM | POA: Diagnosis present

## 2023-05-30 DIAGNOSIS — F322 Major depressive disorder, single episode, severe without psychotic features: Principal | ICD-10-CM | POA: Diagnosis present

## 2023-05-30 DIAGNOSIS — I10 Essential (primary) hypertension: Secondary | ICD-10-CM | POA: Diagnosis present

## 2023-05-30 DIAGNOSIS — F419 Anxiety disorder, unspecified: Secondary | ICD-10-CM | POA: Diagnosis present

## 2023-05-30 DIAGNOSIS — K5900 Constipation, unspecified: Secondary | ICD-10-CM | POA: Diagnosis present

## 2023-05-30 MED ORDER — LORAZEPAM 1 MG PO TABS: 2.0000 mg | ORAL_TABLET | Freq: Three times a day (TID) | ORAL | Status: DC | PRN

## 2023-05-30 MED ORDER — LORAZEPAM 2 MG/ML IJ SOLN: 2.0000 mg | Freq: Three times a day (TID) | INTRAMUSCULAR | Status: DC | PRN

## 2023-05-30 MED ORDER — MAGNESIUM HYDROXIDE 400 MG/5ML PO SUSP: 30.0000 mL | Freq: Every day | ORAL | Status: DC | PRN

## 2023-05-30 MED ORDER — LIDOCAINE 5 % EX PTCH
1.0000 | MEDICATED_PATCH | CUTANEOUS | Status: DC
  Administered 2023-05-30: 1 via TRANSDERMAL
  Filled 2023-05-30: qty 1

## 2023-05-30 MED ORDER — HYDROXYZINE HCL 25 MG PO TABS
25.0000 mg | ORAL_TABLET | Freq: Three times a day (TID) | ORAL | Status: DC | PRN
  Administered 2023-05-30 – 2023-06-01 (×3): 25 mg via ORAL
  Filled 2023-05-30 (×3): qty 1

## 2023-05-30 MED ORDER — ALUM & MAG HYDROXIDE-SIMETH 200-200-20 MG/5ML PO SUSP: 30.0000 mL | ORAL | Status: DC | PRN

## 2023-05-30 MED ORDER — ACETAMINOPHEN 325 MG PO TABS: 650.0000 mg | ORAL_TABLET | Freq: Four times a day (QID) | ORAL | Status: DC | PRN

## 2023-05-30 MED ORDER — NAPROXEN 250 MG PO TABS
500.0000 mg | ORAL_TABLET | Freq: Once | ORAL | Status: AC
  Administered 2023-05-30: 500 mg via ORAL
  Filled 2023-05-30: qty 2

## 2023-05-30 NOTE — ED Notes (Signed)
Pt ambulated to the bathroom.  

## 2023-05-30 NOTE — Progress Notes (Signed)
   05/30/23 1549  Psych Admission Type (Psych Patients Only)  Admission Status Voluntary  Psychosocial Assessment  Patient Complaints Anxiety  Eye Contact Fair  Facial Expression Anxious  Affect Appropriate to circumstance  Speech Logical/coherent  Interaction Assertive  Motor Activity Other (Comment) (WNL)  Appearance/Hygiene In scrubs  Behavior Characteristics Cooperative;Appropriate to situation  Mood Pleasant  Aggressive Behavior  Effect No apparent injury  Thought Process  Coherency WDL  Content WDL  Delusions WDL  Perception WDL  Hallucination None reported or observed  Judgment WDL  Confusion WDL  Danger to Self  Current suicidal ideation? Denies  Danger to Others  Danger to Others None reported or observed

## 2023-05-30 NOTE — Progress Notes (Signed)
   05/30/23 2030  Psych Admission Type (Psych Patients Only)  Admission Status Voluntary  Psychosocial Assessment  Patient Complaints Anxiety  Eye Contact Fair  Facial Expression Anxious  Affect Appropriate to circumstance  Speech Logical/coherent  Interaction Assertive  Motor Activity Slow  Appearance/Hygiene In scrubs  Behavior Characteristics Cooperative  Mood Pleasant  Aggressive Behavior  Effect No apparent injury  Thought Process  Coherency WDL  Content WDL  Delusions WDL  Perception WDL  Hallucination None reported or observed  Judgment WDL  Confusion WDL  Danger to Self  Current suicidal ideation? Denies

## 2023-05-30 NOTE — ED Notes (Signed)
Per NP Rockney Ghee, patient is recommended for inpatient care.

## 2023-05-30 NOTE — Plan of Care (Signed)
  Problem: Education: Goal: Knowledge of [facility] Education information/materials will improve Outcome: Progressing Goal: Emotional status will improve Outcome: Progressing Goal: Mental status will improve Outcome: Progressing Goal: Verbalization of understanding the information provided will improve Outcome: Progressing   

## 2023-05-30 NOTE — Progress Notes (Signed)
Admission Note:   Patient arrived to [facility] from [facility] voluntarily. 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 During the assessment, patient states that she is not suicidal, that she acted out of need for attention with the news of contracting an STD being too much for her to handle. Also says she had taken a drink to cope with the news but that it was just too much. States she has never done anything in the past to harm herself or anyone else. Reports having a history of anxiety which she take buspar for. Patient denies any previous hospitalizations for mental health. States that this was the first time in her life that she felt this way. Says she made the statement to her daughter that she didn't want to be here anymore but feels like she doesn't need to be hospitalized. Denies SI/HI/AVH. Denies drug and tobacco use, reports drinking socially. Patient reports she is no longer with the boyfriend and has moved in with her daughter.
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
## 2023-05-30 NOTE — Plan of Care (Signed)
  Problem: Education: Goal: Mental status will improve Outcome: Progressing   Problem: Activity: Goal: Interest or engagement in activities will improve Outcome: Progressing   Problem: Clinical Measurements: Goal: Will remain free from infection Outcome: Progressing

## 2023-05-30 NOTE — BHH Group Notes (Signed)
Adult Psychoeducational Group Note  Date:  05/30/2023 Time:  9:31 PM  Group Topic/Focus:  Wrap-Up Group:   The focus of this group is to help patients review their daily goal of treatment and discuss progress on daily workbooks.  Participation Level:  Active  Participation Quality:  Appropriate  Affect:  Appropriate  Cognitive:  Appropriate  Insight: Appropriate  Engagement in Group:  Engaged  Modes of Intervention:  Discussion  Additional Comments:   Pt states that she had a good day and was able to speak with her kids today. Pt denies everything   Vevelyn Pat 05/30/2023, 9:31 PM

## 2023-05-30 NOTE — ED Notes (Signed)
Pt with TTS 

## 2023-05-30 NOTE — Progress Notes (Signed)
Pt has been accepted to [facility] TODAY 05/30/2023. Bed assignment: 400-1  Pt meets inpatient criteria per Rockney Ghee, NP  Attending Physician will be Phineas Inches, MD  Report can be called to: - Adult unit: [phone]  Pt can arrive after pending discharges  Care Team Notified: [facility] Rona Ravens, RN, Erick Alley, RN, Orlena Sheldon, RN, and Ruffin Frederick, RN  [place], [place]  05/30/2023 9:35 AM

## 2023-05-30 NOTE — ED Notes (Signed)
Family updated as to patient's status.

## 2023-05-30 NOTE — ED Notes (Signed)
Pt went to bathroom, pt back in bed

## 2023-05-31 ENCOUNTER — Encounter (HOSPITAL_COMMUNITY): Payer: Self-pay

## 2023-05-31 DIAGNOSIS — F322 Major depressive disorder, single episode, severe without psychotic features: Principal | ICD-10-CM

## 2023-05-31 MED ORDER — CLONIDINE HCL 0.1 MG PO TABS
0.1000 mg | ORAL_TABLET | Freq: Once | ORAL | Status: AC
  Administered 2023-05-31: 0.1 mg via ORAL
  Filled 2023-05-31 (×2): qty 1

## 2023-05-31 MED ORDER — TRAZODONE HCL 50 MG PO TABS
50.0000 mg | ORAL_TABLET | Freq: Every day | ORAL | Status: DC
  Administered 2023-05-31 – 2023-06-02 (×3): 50 mg via ORAL
  Filled 2023-05-31 (×6): qty 1

## 2023-05-31 MED ORDER — VALACYCLOVIR HCL 500 MG PO TABS
500.0000 mg | ORAL_TABLET | Freq: Every day | ORAL | Status: DC
  Filled 2023-05-31 (×4): qty 1

## 2023-05-31 NOTE — Progress Notes (Signed)
Nursing Note: 0700-1900  Goal for today: "Getting up and having a positive day."  Pt reports that she slept well last night, appetite is fair and she feels that she is ready to be discharged. "I feel much better, I don't need to be here anymore." Rates that anxiety, depression and hopelessness are 0/10 this am.  Denies A/V hallucinations and is able to verbally contract for safety. Pt observed in and out of dayroom. "This doesn't work for me, these people are too sad. I need to be around positive people."  Pt. encouraged to verbalize needs and concerns, active listening and support provided.  Continued Q 15 minute safety checks.  Observed active participation in group settings.Marland Kitchen     05/31/23 0900  Psych Admission Type (Psych Patients Only)  Admission Status Voluntary  Psychosocial Assessment  Patient Complaints None  Eye Contact Fair  Facial Expression Anxious  Affect Appropriate to circumstance  Speech Logical/coherent  Interaction Assertive  Motor Activity Other (Comment);Posturing (Unremarkable.)  Appearance/Hygiene Unremarkable  Behavior Characteristics Cooperative  Mood Pleasant  Thought Process  Coherency WDL  Content WDL  Delusions None reported or observed  Perception WDL  Hallucination None reported or observed  Judgment WDL  Confusion None  Danger to Self  Current suicidal ideation? Denies  Danger to Others  Danger to Others None reported or observed

## 2023-05-31 NOTE — Plan of Care (Signed)

## 2023-05-31 NOTE — BHH Group Notes (Signed)
BHH Group Notes: (Healthy Relationships)  Date: 05/31/23  Time: 2:45pm-3:00pm  Group Topic: Healthy Relationships: the focus of this group is to help patients identify healthy relationships versus unhealthy relationships characteristics.   Participation Level: Did not attend  Participation Quality: Did not attend  Affect: n/a  Cognitive: n/a  Insight: n/a  Engagement in group: n/a  Modes of intervention: Discussion  Summary of Progress/Problems: Patient did not attend group today.  Harless Litten RN   05/31/23

## 2023-05-31 NOTE — Progress Notes (Signed)
Pt signed 72 hour request for discharge at 11:30am today.

## 2023-05-31 NOTE — Progress Notes (Signed)
Nursing Note  Pt was unsure if she should take Valacyclovir (Valtrex) this evening, stating that she doesn't have any symptoms at this time. Pt did not take medication and plans to talk with provider tomorrow whether she should take or not.

## 2023-05-31 NOTE — BH IP Treatment Plan (Addendum)
Interdisciplinary Treatment and Diagnostic Plan Update  05/31/2023 Time of Session: 10:55 AM  Jamie Olson MRN: [id number]  Principal Diagnosis: MDD (major depressive disorder), single episode, severe , no psychosis (HCC)  Secondary Diagnoses: Principal Problem:   MDD (major depressive disorder), single episode, severe , no psychosis (HCC)   Current Medications:  Current Facility-Administered Medications  Medication Dose Route Frequency Provider Last Rate Last Admin   acetaminophen (TYLENOL) tablet 650 mg  650 mg Oral Q6H PRN Onuoha, Chinwendu V, NP       alum & mag hydroxide-simeth (MAALOX/MYLANTA) 200-200-20 MG/5ML suspension 30 mL  30 mL Oral Q4H PRN Onuoha, Chinwendu V, NP       hydrOXYzine (ATARAX) tablet 25 mg  25 mg Oral TID PRN Onuoha, Chinwendu V, NP   25 mg at 05/30/23 2128   LORazepam (ATIVAN) tablet 2 mg  2 mg Oral TID PRN Onuoha, Chinwendu V, NP       Or   LORazepam (ATIVAN) injection 2 mg  2 mg Intramuscular TID PRN Onuoha, Chinwendu V, NP       magnesium hydroxide (MILK OF MAGNESIA) suspension 30 mL  30 mL Oral Daily PRN Onuoha, Chinwendu V, NP       PTA Medications: Medications Prior to Admission  Medication Sig Dispense Refill Last Dose   busPIRone (BUSPAR) 7.5 MG tablet Take 7.5 mg by mouth 2 (two) times daily.   Past Week   diclofenac (VOLTAREN) 75 MG EC tablet Take 1 tablet by mouth 2 (two) times daily.   Past Month   phentermine (ADIPEX-P) 37.5 MG tablet Take 37.5 mg by mouth daily.   Past Month    Patient Stressors:    Patient Strengths:    Treatment Modalities: Medication Management, Group therapy, Case management,  1 to 1 session with clinician, Psychoeducation, Recreational therapy.   Physician Treatment Plan for Primary Diagnosis: MDD (major depressive disorder), single episode, severe , no psychosis (HCC) Long Term Goal(s): Improvement in symptoms so as ready for discharge   Short Term Goals: Ability to identify and develop effective coping  behaviors will improve Ability to maintain clinical measurements within normal limits will improve Compliance with prescribed medications will improve Ability to identify triggers associated with substance abuse/mental health issues will improve Ability to identify changes in lifestyle to reduce recurrence of condition will improve Ability to verbalize feelings will improve Ability to disclose and discuss suicidal ideas Ability to demonstrate self-control will improve  Medication Management: Evaluate patient's response, side effects, and tolerance of medication regimen.  Therapeutic Interventions: 1 to 1 sessions, Unit Group sessions and Medication administration.  Evaluation of Outcomes: Not Progressing  Physician Treatment Plan for Secondary Diagnosis: Principal Problem:   MDD (major depressive disorder), single episode, severe , no psychosis (HCC)  Long Term Goal(s): Improvement in symptoms so as ready for discharge   Short Term Goals: Ability to identify and develop effective coping behaviors will improve Ability to maintain clinical measurements within normal limits will improve Compliance with prescribed medications will improve Ability to identify triggers associated with substance abuse/mental health issues will improve Ability to identify changes in lifestyle to reduce recurrence of condition will improve Ability to verbalize feelings will improve Ability to disclose and discuss suicidal ideas Ability to demonstrate self-control will improve     Medication Management: Evaluate patient's response, side effects, and tolerance of medication regimen.  Therapeutic Interventions: 1 to 1 sessions, Unit Group sessions and Medication administration.  Evaluation of Outcomes: Not Progressing   RN Treatment Plan for  Primary Diagnosis: MDD (major depressive disorder), single episode, severe , no psychosis (HCC) Long Term Goal(s): Knowledge of disease and therapeutic regimen to  maintain health will improve  Short Term Goals: Ability to remain free from injury will improve, Ability to verbalize frustration and anger appropriately will improve, Ability to demonstrate self-control, Ability to participate in decision making will improve, Ability to verbalize feelings will improve, Ability to disclose and discuss suicidal ideas, Ability to identify and develop effective coping behaviors will improve, and Compliance with prescribed medications will improve  Medication Management: RN will administer medications as ordered by provider, will assess and evaluate patient's response and provide education to patient for prescribed medication. RN will report any adverse and/or side effects to prescribing provider.  Therapeutic Interventions: 1 on 1 counseling sessions, Psychoeducation, Medication administration, Evaluate responses to treatment, Monitor vital signs and CBGs as ordered, Perform/monitor CIWA, COWS, AIMS and Fall Risk screenings as ordered, Perform wound care treatments as ordered.  Evaluation of Outcomes: Not Progressing   LCSW Treatment Plan for Primary Diagnosis: MDD (major depressive disorder), single episode, severe , no psychosis (HCC) Long Term Goal(s): Safe transition to appropriate next level of care at discharge, Engage patient in therapeutic group addressing interpersonal concerns.  Short Term Goals: Engage patient in aftercare planning with referrals and resources, Increase social support, Increase ability to appropriately verbalize feelings, Increase emotional regulation, Facilitate acceptance of mental health diagnosis and concerns, Facilitate patient progression through stages of change regarding substance use diagnoses and concerns, Identify triggers associated with mental health/substance abuse issues, and Increase skills for wellness and recovery  Therapeutic Interventions: Assess for all discharge needs, 1 to 1 time with Social worker, Explore available  resources and support systems, Assess for adequacy in community support network, Educate family and significant other(s) on suicide prevention, Complete Psychosocial Assessment, Interpersonal group therapy.  Evaluation of Outcomes: Not Progressing   Progress in Treatment: Attending groups: Yes. Participating in groups: Yes. Taking medication as prescribed: No. Toleration medication: No. Family/Significant other contact made: No, will contact:  Whoever pt give CSW permission to contact  Patient understands diagnosis: Yes. Discussing patient identified problems/goals with staff: Yes. Medical problems stabilized or resolved: Yes. Denies suicidal/homicidal ideation: Yes. Issues/concerns per patient self-inventory: No.   New problem(s) identified: No, Describe:  None reported   New Short Term/Long Term Goal(s):   medication stabilization, elimination of SI thoughts, development of comprehensive mental wellness plan.    Patient Goals:  " stay positive "   Discharge Plan or Barriers: Patient recently admitted. CSW will continue to follow and assess for appropriate referrals and possible discharge planning.    Reason for Continuation of Hospitalization: Anxiety Depression Medication stabilization Suicidal ideation  Estimated Length of Stay: 3-5 days   Last 3 [place] Suicide Severity Risk Score: Flowsheet Row [facility] (Current[facility] 05/29/2023 in [facility] at [facility] ED from 04/09/2021 in [facility] at [facility] RISK CATEGORY No Risk High Risk Error: Question 6 not populated       Last [facility] 2/9 Scores:     No data to display          Scribe for Treatment Team: Beather Arbour 05/31/2023 11:37 AM

## 2023-05-31 NOTE — Progress Notes (Signed)
   05/31/23 2128  Psych Admission Type (Psych Patients Only)  Admission Status Voluntary  Psychosocial Assessment  Patient Complaints None  Eye Contact Fair  Facial Expression Anxious  Affect Appropriate to circumstance  Speech Logical/coherent  Interaction Assertive  Motor Activity Slow  Appearance/Hygiene Unremarkable  Behavior Characteristics Cooperative  Mood Pleasant  Thought Process  Coherency WDL  Content WDL  Delusions None reported or observed  Perception WDL  Hallucination None reported or observed  Judgment WDL  Confusion None  Danger to Self  Current suicidal ideation? Denies  Danger to Others  Danger to Others None reported or observed

## 2023-05-31 NOTE — [facility] Suicide Risk Assessment (Signed)
[facility] INPATIENT:  Family/Significant Other Suicide Prevention Education  Suicide Prevention Education:  Education Completed; -Cecilio Asper daughter [phone],  has been identified by the patient as the family member/significant other with whom the patient will be residing, and identified as the person(s) who will aid the patient in the event of a mental health crisis (suicidal ideations/suicide attempt).  With written consent from the patient, the family member/significant other has been provided the following suicide prevention education, prior to the and/or following the discharge of the patient.  The suicide prevention education provided includes the following: Suicide risk factors Suicide prevention and interventions National Suicide Hotline telephone number [facility] assessment telephone number [facility] Emergency Assistance 911 [facility] and/or Residential Mobile Crisis Unit telephone number  Request made of family/significant other to: Remove weapons (e.g., guns, rifles, knives), all items previously/currently identified as safety concern.   Remove drugs/medications (over-the-counter, prescriptions, illicit drugs), all items previously/currently identified as a safety concern.  The family member/significant other verbalizes understanding of the suicide prevention education information provided.  The family member/significant other agrees to remove the items of safety concern listed above.  Starleen Arms 05/31/2023, 1:04 PM

## 2023-05-31 NOTE — Progress Notes (Signed)
Adult Psychoeducational Group Note  Date:  05/31/2023 Time:  10:44 PM  Group Topic/Focus:  Wrap-Up Group:   The focus of this group is to help patients review their daily goal of treatment and discuss progress on daily workbooks.  Participation Level:  Did Not Attend  Participation Quality:  Inattentive  Affect:  Not Congruent  Cognitive:  Alert  Insight: None  Engagement in Group:  None  Modes of Intervention:  Discussion and Support  Additional Comments:  Pt did not attend wrap-up group.  Swayzie Choate Katrinka Blazing 05/31/2023, 10:44 PM

## 2023-05-31 NOTE — [facility] Suicide Risk Assessment (Signed)
Suicide Risk Assessment  Admission Assessment    [facility] Admission Suicide Risk Assessment   Nursing information obtained from:  Patient  Demographic factors:  NA  Current Mental Status: Alert, oriented x 4.  Loss Factors:  NA  Historical Factors:  NA  Risk Reduction Factors:  Living with another person, especially a relative, Positive social support  Total Time spent with patient: 1 hour  Principal Problem: MDD (major depressive disorder), single episode, severe , no psychosis (HCC)  Diagnosis:  Principal Problem:   MDD (major depressive disorder), single episode, severe , no psychosis (HCC)  Subjective Data: See H&P.  Continued Clinical Symptoms:  Alcohol Use Disorder Identification Test Final Score (AUDIT): 1 The "Alcohol Use Disorders Identification Test", Guidelines for Use in Primary Care, Second Edition.  World Science writer [facility]). Score between 0-7:  no or low risk or alcohol related problems. Score between 8-15:  moderate risk of alcohol related problems. Score between 16-19:  high risk of alcohol related problems. Score 20 or above:  warrants further diagnostic evaluation for alcohol dependence and treatment.  CLINICAL FACTORS:   Depression:   Comorbid alcohol abuse/dependence Impulsivity Alcohol/Substance Abuse/Dependencies More than one psychiatric diagnosis  Musculoskeletal: Strength & Muscle Tone: within normal limits Gait & Station: normal Patient leans: N/A  Psychiatric Specialty Exam:  Presentation  General Appearance: Appropriate for Environment; Casual; Fairly Groomed  Eye Contact:Good  Speech:Clear and Coherent; Normal Rate  Speech Volume:Normal  Handedness:Right  Mood and Affect  Mood:-- (Patient reports mild anxiety, wanting to know when to discharge. Denies any symptoms of depression.)  Affect:Appropriate; Congruent  Thought Process  Thought Processes:Coherent; Goal Directed; Linear  Descriptions of  Associations:Intact  Orientation:Full (Time, Place and Person)  Thought Content:Logical  History of Schizophrenia/Schizoaffective disorder:No  Duration of Psychotic Symptoms:No data recorded Hallucinations:Hallucinations: None  Ideas of Reference:None  Suicidal Thoughts:Suicidal Thoughts: No  Homicidal Thoughts:Homicidal Thoughts: No  Sensorium  Memory:Immediate Good; Recent Good; Remote Good  Judgment:Fair  Insight:Fair   Executive Functions  Concentration:Fair  Attention Span:Fair  Recall:Good  Fund of Knowledge:Fair  Language: Good  Psychomotor Activity  Psychomotor Activity:Psychomotor Activity: Normal  Assets  Assets:Desire for Improvement; Communication Skills; Financial Resources/Insurance; Housing; Physical Health; Resilience; Social Support  Sleep  Sleep:Sleep: Good Number of Hours of Sleep: 8  Physical Exam: See H&P. Blood pressure (!) 135/96, pulse 82, temperature 98.3 F (36.8 C), temperature source Oral, resp. rate 18, height 5\' 4"  (1.626 m), weight 91.8 kg, SpO2 100%. Body mass index is 34.74 kg/m.  COGNITIVE FEATURES THAT CONTRIBUTE TO RISK:  Thought constriction (tunnel vision)    SUICIDE RISK:   Mild:  Suicidal ideation of limited frequency, intensity, duration, and specificity.  There are no identifiable plans, no associated intent, mild dysphoria and related symptoms, good self-control (both objective and subjective assessment), few other risk factors, and identifiable protective factors, including available and accessible social support.  PLAN OF CARE: See H&P.  I certify that inpatient services furnished can reasonably be expected to improve the patient's condition.   Armandina Stammer, NP, pmhnp, fnp-bc. 05/31/2023, 12:30 PM

## 2023-05-31 NOTE — Group Note (Signed)
Recreation Therapy Group Note   Group Topic:Problem Solving  Group Date: 05/31/2023 Start Time: 1610 End Time: 1005 Facilitators: Rayvin Abid-McCall, LRT,CTRS Location: [address] Dayroom   Goal Area(s) Addresses:  Patient will effectively work in a team with other group members. Patient will verbalize importance of using appropriate problem solving techniques.  Patient will identify positive change associated with effective problem solving skills.    Group Description: Brain Teasers. Patients form groups of no more than 3 people. Patients given two sheets of brain teasers each. Patients work together to figure out the answers to each puzzle. Patients given 20 minutes to work on the brain teasers. LRT will go over the final answers with patients.   Affect/Mood: Appropriate   Participation Level: Engaged   Participation Quality: Independent   Behavior: Appropriate   Speech/Thought Process: Focused   Insight: Good   Judgement: Good   Modes of Intervention: STEM Activity   Patient Response to Interventions:  Engaged   Education Outcome:  Acknowledges education   Clinical Observations/Individualized Feedback: Pt was bright and engaged throughout group session.     Plan: Continue to engage patient in RT group sessions 2-3x/week.   Jiayi Lengacher-McCall, LRT,CTRS 05/31/2023 12:58 PM
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
## 2023-05-31 NOTE — H&P (Signed)
Psychiatric Admission Assessment Adult  Patient Identification: Jamie Olson MRN:  [id number]  Date of Evaluation:  05/31/2023  Chief Complaint: Worsening suicidal ideations.  Principal Diagnosis: MDD (major depressive disorder), single episode, severe , no psychosis (HCC)  Diagnosis:  Principal Problem:   MDD (major depressive disorder), single episode, severe , no psychosis (HCC)  History of Present Illness: This is the first psychiatric admission/evaluation in this [facility] for this 47 year old AA female with no known hx of mental health issues, hospitalizations or treatments. Admitted to the [facility] from the [facility] in [place], [place] with chief complaints of feeling depressed & having suicidal ideations while considering to overdose on pills. Patient apparently attempted to jump off of a moving car while being taken to the hospital. The trigger for the depression/suicidal ideations was because patient recently found out that she has contracted genital herpes virus from her boyfriend of 15 years. A review of her toxicology results indicated that patient's blood alcohol level was 86 & her UDS was negative of all other substances. During this evaluation, Jamie Olson reports,   "I went to the [facility] on Thursday morning. I have been sad & crying a lot lately about what has happened to me. I was exposed to genital herpes from the guy that I have been with & knowing for 15 years.  I found this out on Thursday by going into my chart & seeing the result. I had gone to my primary care provider on July 19th of this year. I asked for the STD tests because I heard that the guy that I have been with has STD. After the test, I went home, then looked in my chart & found out that I have tested positive for herpes. I cried quite a bit, called my daughter. I told her that I did not want to go on any more. My daughter became worried & took me to the [facility] to get checked out. I'm now in  good spirit. I'm not depressed at all. I have prayed about this & I'm good now. I Have not been in any psychiatric hospital in the past.  While at my doctor's office on the day I was tested for STDs, I was worrying a lot about what the test will show. I did ask my doctor for some anxiety medicine to help keep me calm. He started me on Buspar. I took the Buspar for few days & stopped. I do not want to take this medicine any more. While I was crying & feeling sorry for myself two days ago, I was also drinking some alcohol (Vodka). That is why my alcohol level at the ED was high. I'm not having any withdrawal symptoms. I'm not having any tremors. I feel good. I'm now asking to be discharged to my daughter's home. I have a lot going on. I'm about to move from my home to move to [place], [place] to stay with my daughter & her family. Also, there is a celebration for mom happening tomorrow that required all her children to be present. I do not want to miss this celebration".   Objective: Jamie Olson is seen in her room. She presents alert, oriented & aware of situation. She presents with a bright affect, making good eye contact & verbally responsive. She denies any hx of suicide attempts. She denies any familial hx of suicide attempts or completion.She currently denies any SIHI, AVH, delusional thoughts or paranoia. She does not appear to be responding  to any internal stimuli. Beila is currently asking to be discharged as she denies any symptoms of depression, however, she is worried that she will miss the celebration happening tomorrow to honor her mother. Patient has been informed that she will not be discharged today, however, her tentative discharge date is probably Monday 06-03-23. Discussed this case with the treatment team. See the treatment plan. Started patient on Valtrex 50 mg daily. Continue current plan of care as already in progress.   Associated Signs/Symptoms:  Depression Symptoms:   Patient currently  denies any symptoms of depression. However, reports feeling a little anxious about when to get discharged from here.   (Hypo) Manic Symptoms:  Impulsivity,  Anxiety Symptoms:   Reports mild anxiety about getting discharged from here.  Psychotic Symptoms:   Patient currently denies any AVH, delusional thoughts or paranoia. She does not appear to be responding to any internal stimuli.  PTSD Symptoms: Patient denies any PTSD symptoms or or hx of events. NA  Total Time spent with patient: 1 hour  Past Psychiatric History: Anxiety disorder.  Is the patient at risk to self? No.  Has the patient been a risk to self in the past 6 months? Yes.    Has the patient been a risk to self within the distant past? No.  Is the patient a risk to others? No.  Has the patient been a risk to others in the past 6 months? No.  Has the patient been a risk to others within the distant past? No.   [place] Scale:  Flowsheet Row [facility] ([facility]) [facility] from 05[facility]29/2023 in [facility] Department at [facility] ED from 04/09/2021 in [facility] at [facility] RISK CATEGORY No Risk High Risk Error: Question 6 not populated      Prior Inpatient Therapy: No. If yes, describe: NA   Prior Outpatient Therapy: No. If yes, describe: NA   Alcohol Screening: 1. How often do you have a drink containing alcohol?: Monthly or less 2. How many drinks containing alcohol do you have on a typical day when you are drinking?: 1 or 2 3. How often do you have six or more drinks on one occasion?: Never AUDIT-C Score: 1 4. How often during the last year have you found that you were not able to stop drinking once you had started?: Never 5. How often during the last year have you failed to do what was normally expected from you because of drinking?: Never 6. How often during the last year have you needed a first drink in the morning to get  yourself going after a heavy drinking session?: Never 7. How often during the last year have you had a feeling of guilt of remorse after drinking?: Never 8. How often during the last year have you been unable to remember what happened the night before because you had been drinking?: Never 9. Have you or someone else been injured as a result of your drinking?: No 10. Has a relative or friend or a doctor or another health worker been concerned about your drinking or suggested you cut down?: No Alcohol Use Disorder Identification Test Final Score (AUDIT): 1  Substance Abuse History in the last 12 months:  Yes.    Consequences of Substance Abuse: Discussed with patient during this admission evaluation. Medical Consequences:  Liver damage, Possible death by overdose Legal Consequences:  Arrests, jail time, Loss of driving privilege. Family  Consequences:  Family discord, divorce and or separation.  Previous Psychotropic Medications: Yes   Psychological Evaluations: No   Past Medical History:  Past Medical History:  Diagnosis Date   Anxiety    History reviewed. No pertinent surgical history.  Family History: History reviewed. No pertinent family history.  Family Psychiatric  History: Patient currently denies  Tobacco Screening:  Social History   Tobacco Use  Smoking Status Never  Smokeless Tobacco Never    BH Tobacco Counseling     Are you interested in Tobacco Cessation Medications?  No value filed. Counseled patient on smoking cessation:  No value filed. Reason Tobacco Screening Not Completed: No value filed.       Social History: Reports being single, has 3 children, employed, lives in [place] with daughter. Social History   Substance and Sexual Activity  Alcohol Use Not Currently     Social History   Substance and Sexual Activity  Drug Use Never    Additional Social History:  Allergies:  No Known Allergies  Lab Results:  Results for orders placed or  performed during the hospital encounter of 05/29/23 (from the past 48 hour(s))  Rapid urine drug screen (hospital performed)     Status: None   Collection Time: 05/29/23 10:55 PM  Result Value Ref Range   Opiates NONE DETECTED NONE DETECTED   Cocaine NONE DETECTED NONE DETECTED   Benzodiazepines NONE DETECTED NONE DETECTED   Amphetamines NONE DETECTED NONE DETECTED   Tetrahydrocannabinol NONE DETECTED NONE DETECTED   Barbiturates NONE DETECTED NONE DETECTED    Comment: (NOTE) DRUG SCREEN FOR MEDICAL PURPOSES ONLY.  IF CONFIRMATION IS NEEDED FOR ANY PURPOSE, NOTIFY LAB WITHIN 5 DAYS.  LOWEST DETECTABLE LIMITS FOR URINE DRUG SCREEN Drug Class                     Cutoff (ng/mL) Amphetamine and metabolites    1000 Barbiturate and metabolites    200 Benzodiazepine                 200 Opiates and metabolites        300 Cocaine and metabolites        300 THC                            50 Performed at [facility], [address] [place], [place] [address]   Comprehensive metabolic panel     Status: None   Collection Time: 05/29/23 10:57 PM  Result Value Ref Range   Sodium 139 135 - 145 mmol/L   Potassium 3.9 3.5 - 5.1 mmol/L   Chloride 103 98 - 111 mmol/L   CO2 26 22 - 32 mmol/L   Glucose, Bld 98 70 - 99 mg/dL    Comment: Glucose reference range applies only to samples taken after fasting for at least 8 hours.   BUN 9 6 - 20 mg/dL   Creatinine, Ser 1.91 0.44 - 1.00 mg/dL   Calcium 9.1 8.9 - 47.8 mg/dL   Total Protein 7.9 6.5 - 8.1 g/dL   Albumin 4.1 3.5 - 5.0 g/dL   AST 21 15 - 41 U/L   ALT 18 0 - 44 U/L   Alkaline Phosphatase 79 38 - 126 U/L   Total Bilirubin 0.8 0.3 - 1.2 mg/dL   GFR, Estimated >29 >56 mL/min    Comment: (NOTE) Calculated using the CKD-EPI Creatinine Equation (2021)    Anion gap 10 5 -  15    Comment: Performed at [facility], [address] [place], [place] [address]  Ethanol     Status: Abnormal   Collection Time: 05/29/23 10:57 PM  Result Value Ref  Range   Alcohol, Ethyl (B) 86 (H) <10 mg/dL    Comment: (NOTE) Lowest detectable limit for serum alcohol is 10 mg/dL.  For medical purposes only. Performed at [facility], [address], [place], [place] [address]   Salicylate level     Status: Abnormal   Collection Time: 05/29/23 10:57 PM  Result Value Ref Range   Salicylate Lvl <7.0 (L) 7.0 - 30.0 mg/dL    Comment: Performed at [facility], [address] [place], [place] [address]  Acetaminophen level     Status: Abnormal   Collection Time: 05/29/23 10:57 PM  Result Value Ref Range   Acetaminophen (Tylenol), Serum <10 (L) 10 - 30 ug/mL    Comment: (NOTE) Therapeutic concentrations vary significantly. A range of 10-30 ug/mL  may be an effective concentration for many patients. However, some  are best treated at concentrations outside of this range. Acetaminophen concentrations >150 ug/mL at 4 hours after ingestion  and >50 ug/mL at 12 hours after ingestion are often associated with  toxic reactions.  Performed at [facility], [address] [place], [place] [address]   cbc     Status: None   Collection Time: 05/29/23 10:57 PM  Result Value Ref Range   WBC 7.2 4.0 - 10.5 K/uL   RBC 3.96 3.87 - 5.11 MIL/uL   Hemoglobin 12.1 12.0 - 15.0 g/dL   HCT 10.2 72.5 - 36.6 %   MCV 92.9 80.0 - 100.0 fL   MCH 30.6 26.0 - 34.0 pg   MCHC 32.9 30.0 - 36.0 g/dL   RDW 44.0 34.7 - 42.5 %   Platelets 292 150 - 400 K/uL   nRBC 0.0 0.0 - 0.2 %    Comment: Performed at [facility], [address] [place], [place] [address]  POC urine preg, ED     Status: None   Collection Time: 05/29/23 10:58 PM  Result Value Ref Range   Preg Test, Ur NEGATIVE NEGATIVE    Comment:        THE SENSITIVITY OF THIS METHODOLOGY IS >24 mIU/mL    Blood Alcohol level:  Lab Results  Component Value Date   ETH 86 (H) 05/29/2023   Metabolic Disorder Labs:  No results found for: "HGBA1C", "MPG" No results found for: "PROLACTIN" No results found for:  "CHOL", "TRIG", "HDL", "CHOLHDL", "VLDL", "LDLCALC"  Current Medications: Current Facility-Administered Medications  Medication Dose Route Frequency Provider Last Rate Last Admin   acetaminophen (TYLENOL) tablet 650 mg  650 mg Oral Q6H PRN Onuoha, Chinwendu V, NP       alum & mag hydroxide-simeth (MAALOX/MYLANTA) 200-200-20 MG/5ML suspension 30 mL  30 mL Oral Q4H PRN Onuoha, Chinwendu V, NP       hydrOXYzine (ATARAX) tablet 25 mg  25 mg Oral TID PRN Onuoha, Chinwendu V, NP   25 mg at 05/30/23 2128   LORazepam (ATIVAN) tablet 2 mg  2 mg Oral TID PRN Onuoha, Chinwendu V, NP       Or   LORazepam (ATIVAN) injection 2 mg  2 mg Intramuscular TID PRN Onuoha, Chinwendu V, NP       magnesium hydroxide (MILK OF MAGNESIA) suspension 30 mL  30 mL Oral Daily PRN Onuoha, Chinwendu V, NP       PTA Medications: Medications  Prior to Admission  Medication Sig Dispense Refill Last Dose   busPIRone (BUSPAR) 7.5 MG tablet Take 7.5 mg by mouth 2 (two) times daily.   Past Week   diclofenac (VOLTAREN) 75 MG EC tablet Take 1 tablet by mouth 2 (two) times daily.   Past Month   phentermine (ADIPEX-P) 37.5 MG tablet Take 37.5 mg by mouth daily.   Past Month   Musculoskeletal: Strength & Muscle Tone: within normal limits Gait & Station: normal Patient leans: N/A  Psychiatric Specialty Exam:  Presentation  General Appearance: Appropriate for Environment; Casual; Fairly Groomed  Eye Contact:Good  Speech:Clear and Coherent; Normal Rate  Speech Volume:Normal  Handedness:Right   Mood and Affect  Mood:-- (Patient reports mild anxiety, wanting to know when to discharge. Denies any symptoms of depression.)  Affect:Appropriate; Congruent  Thought Process  Thought Processes:Coherent; Goal Directed; Linear  Duration of Psychotic Symptoms: NA  Past Diagnosis of Schizophrenia or Psychoactive disorder: No  Descriptions of Associations:Intact  Orientation:Full (Time, Place and Person)  Thought  Content:Logical  Hallucinations:Hallucinations: None  Ideas of Reference:None  Suicidal Thoughts:Suicidal Thoughts: No  Homicidal Thoughts:Homicidal Thoughts: No  Sensorium  Memory:Immediate Good; Recent Good; Remote Good  Judgment:Fair  Insight:Fair  Executive Functions  Concentration:Fair  Attention Span:Fair  Recall:Good  Fund of Knowledge:Fair  Language:Good  Psychomotor Activity  Psychomotor Activity:Psychomotor Activity: Normal  Assets  Assets:Desire for Improvement; Manufacturing systems engineer; Financial Resources/Insurance; Housing; Physical Health; Resilience; Social Support  Sleep  Sleep:Sleep: Good Number of Hours of Sleep: 8  Physical Exam: Physical Exam Vitals and nursing note reviewed.  HENT:     Nose: Nose normal.     Mouth/Throat:     Pharynx: Oropharynx is clear.  Cardiovascular:     Pulses: Normal pulses.  Pulmonary:     Effort: Pulmonary effort is normal.  Genitourinary:    Comments: Deferred Musculoskeletal:        General: Normal range of motion.     Cervical back: Normal range of motion.  Skin:    General: Skin is warm and dry.  Neurological:     General: No focal deficit present.     Mental Status: She is alert and oriented to person, place, and time.   Review of Systems  Constitutional:  Negative for chills, diaphoresis and fever.  HENT:  Negative for congestion and sore throat.   Respiratory:  Negative for cough, shortness of breath and wheezing.   Cardiovascular:  Negative for chest pain and palpitations.  Gastrointestinal:  Negative for abdominal pain, constipation, diarrhea, heartburn, nausea and vomiting.  Musculoskeletal:  Negative for joint pain and myalgias.  Neurological:  Negative for dizziness, tingling, tremors, sensory change, speech change, focal weakness, seizures, loss of consciousness, weakness and headaches.  Endo/Heme/Allergies:        NKDA  Psychiatric/Behavioral:  Positive for substance abuse (Toxicology  reports, BAL 86). Negative for hallucinations and memory loss. The patient is nervous/anxious.    Blood pressure (!) 135/96, pulse 82, temperature 98.3 F (36.8 C), temperature source Oral, resp. rate 18, height 5\' 4"  (1.626 m), weight 91.8 kg, SpO2 100%. Body mass index is 34.74 kg/m.  Treatment Plan Summary: Daily contact with patient to assess and evaluate symptoms and progress in treatment and Medication management.   Principal/active diagnoses. MDD (major depressive disorder), single episode, severe , no psychosis.   Associated symptoms.  Suicidal ideations.  Plan: The risks/benefits/side-effects/alternatives to the medications in use were discussed in detail with the patient and time was given for patient's questions. The patient  consents to medication trial.   - Patient currently declines to be on any antidepressant medications. She says she does not want to resume her Buspar any more.   Other PRNS -Continue Tylenol 650 mg every 6 hours PRN for mild pain -Continue Maalox 30 ml Q 4 hrs PRN for indigestion -Continue MOM 30 ml po Q 6 hrs for constipation  Safety and Monitoring: Voluntary admission to inpatient psychiatric unit for safety, stabilization and treatment Daily contact with patient to assess and evaluate symptoms and progress in treatment Patient's case to be discussed in multi-disciplinary team meeting Observation Level : q15 minute checks Vital signs: q12 hours Precautions: Safety  Discharge Planning: Social work and case management to assist with discharge planning and identification of hospital follow-up needs prior to discharge Estimated LOS: 5-7 days Discharge Concerns: Need to establish a safety plan; Medication compliance and effectiveness Discharge Goals: Return home with outpatient referrals for mental health follow-up including medication management/psychotherapy  Observation Level/Precautions:  15 minute checks  Laboratory:   Per ED, current lab  results reviewed. Will obtain TSH, hgba1c, lipid panel, u/a.  Psychotherapy: Enrolled in the group sessions.  Medications: See MAR.  Consultations: As needed.    Discharge Concerns: Safety, mood stability.  Estimated LOS: 3-5 days.  Other: NA   Physician Treatment Plan for Primary Diagnosis: MDD (major depressive disorder), single episode, severe , no psychosis (HCC)  Long Term Goal(s): Improvement in symptoms so as ready for discharge  Short Term Goals: Ability to identify changes in lifestyle to reduce recurrence of condition will improve, Ability to verbalize feelings will improve, Ability to disclose and discuss suicidal ideas, and Ability to demonstrate self-control will improve  Physician Treatment Plan for Secondary Diagnosis: Principal Problem:   MDD (major depressive disorder), single episode, severe , no psychosis (HCC)  Long Term Goal(s): Improvement in symptoms so as ready for discharge  Short Term Goals: Ability to identify and develop effective coping behaviors will improve, Ability to maintain clinical measurements within normal limits will improve, Compliance with prescribed medications will improve, and Ability to identify triggers associated with substance abuse/mental health issues will improve  I certify that inpatient services furnished can reasonably be expected to improve the patient's condition.    Armandina Stammer, NP, pmhnp, fnp-bc. 8/2/20241:59 PM

## 2023-05-31 NOTE — [facility] Group Notes (Signed)
Adult Psychoeducational Group Note  Date:  05/31/2023 Time:  1:05 PM  Group Topic/Focus:  Dimensions of Wellness:   The focus of this group is to introduce the topic of wellness and discuss the role each dimension of wellness plays in total health. Goals Group:   The focus of this group is to help patients establish daily goals to achieve during treatment and discuss how the patient can incorporate goal setting into their daily lives to aide in recovery.  Participation Level:  Active  Participation Quality:  Appropriate  Affect:  Appropriate  Cognitive:  Appropriate  Insight: Appropriate  Engagement in Group:  Engaged  Modes of Intervention:  Discussion and Exploration  Additional Comments:  Pt participated in group. Pt stated her goal is to have a productive day. Facilitator provided group with "Who am I? Who do I want to be? Worksheets. Pt actively participated in discussion and topic of self-exploration.    Euretha Najarro 05/31/2023, 1:05 PM

## 2023-06-01 ENCOUNTER — Encounter (HOSPITAL_COMMUNITY): Payer: Self-pay | Admitting: Nurse Practitioner

## 2023-06-01 MED ORDER — AMLODIPINE BESYLATE 5 MG PO TABS
5.0000 mg | ORAL_TABLET | Freq: Every day | ORAL | Status: DC
  Administered 2023-06-02 – 2023-06-03 (×2): 5 mg via ORAL
  Filled 2023-06-01 (×7): qty 1

## 2023-06-01 NOTE — Group Note (Unsigned)
Date:  06/01/2023 Time:  9:12 AM  Group Topic/Focus:  Goals Group:   The focus of this group is to help patients establish daily goals to achieve during treatment and discuss how the patient can incorporate goal setting into their daily lives to aide in recovery. Orientation:   The focus of this group is to educate the patient on the purpose and policies of crisis stabilization and provide a format to answer questions about their admission.  The group details unit policies and expectations of patients while admitted.     Participation Level:  {BHH PARTICIPATION HYQMV:78469}  Participation Quality:  {B[facility] PARTICIPATION QUALITY:22265}  Affect:  {B[facility] AFFECT:22266}  Cognitive:  {B[facility] COGNITIVE:22267}  Insight: {BHH Insight2:20797}  Engagement in Group:  {BHH ENGAGEMENT IN GEXBM:84132}  Modes of Intervention:  {BHH MODES OF INTERVENTION:22269}  Additional Comments:  ***  Jaquita Rector 06/01/2023, 9:12 AM

## 2023-06-01 NOTE — Progress Notes (Signed)
   06/01/23 2254  Psych Admission Type (Psych Patients Only)  Admission Status Voluntary  Psychosocial Assessment  Patient Complaints None  Eye Contact Fair  Facial Expression Animated  Affect Appropriate to circumstance  Speech Logical/coherent  Interaction Assertive  Motor Activity Other (Comment) (WDL)  Appearance/Hygiene Unremarkable  Behavior Characteristics Appropriate to situation  Mood Pleasant  Thought Process  Coherency WDL  Content WDL  Delusions None reported or observed  Perception WDL  Hallucination None reported or observed  Judgment WDL  Confusion None  Danger to Self  Current suicidal ideation? Denies  Danger to Others  Danger to Others None reported or observed

## 2023-06-01 NOTE — Plan of Care (Signed)
  Problem: Education: Goal: Emotional status will improve Outcome: Progressing Goal: Mental status will improve Outcome: Progressing Goal: Verbalization of understanding the information provided will improve Outcome: Progressing   Problem: Activity: Goal: Interest or engagement in activities will improve Outcome: Progressing Goal: Sleeping patterns will improve Outcome: Progressing   Problem: Coping: Goal: Ability to verbalize frustrations and anger appropriately will improve Outcome: Progressing Goal: Ability to demonstrate self-control will improve Outcome: Progressing   Problem: Health Behavior/Discharge Planning: Goal: Identification of resources available to assist in meeting health care needs will improve Outcome: Progressing Goal: Compliance with treatment plan for underlying cause of condition will improve Outcome: Progressing   Problem: Physical Regulation: Goal: Ability to maintain clinical measurements within normal limits will improve Outcome: Progressing   Problem: Safety: Goal: Periods of time without injury will increase Outcome: Progressing   Problem: Education: Goal: Ability to state activities that reduce stress will improve Outcome: Progressing   Problem: Coping: Goal: Ability to identify and develop effective coping behavior will improve Outcome: Progressing   Problem: Self-Concept: Goal: Ability to identify factors that promote anxiety will improve Outcome: Progressing Goal: Level of anxiety will decrease Outcome: Progressing Goal: Ability to modify response to factors that promote anxiety will improve Outcome: Progressing   Problem: Education: Goal: Knowledge of General Education information will improve Description: Including pain rating scale, medication(s)/side effects and non-pharmacologic comfort measures Outcome: Progressing   Problem: Health Behavior/Discharge Planning: Goal: Ability to manage health-related needs will  improve Outcome: Progressing

## 2023-06-01 NOTE — Progress Notes (Signed)
Adult Psychoeducational Group Note  Date:  06/01/2023 Time:  6:21 AM  Group Topic/Focus:  Wrap-Up Group:   The focus of this group is to help patients review their daily goal of treatment and discuss progress on daily workbooks.  Participation Level:  Active  Participation Quality:  Appropriate  Affect:  Appropriate  Cognitive:  Appropriate  Insight: Appropriate  Engagement in Group:  Engaged  Modes of Intervention:  Discussion and Support  Additional Comments:  Pt states goal today, was to remained focus while being here. Pt states not achieving this goal. Pt states feeling more positive and feels like she is ready to go. Pt states goal tomorrow, was to wake up and see another day.  Rashia Mckesson Katrinka Blazing 06/01/2023, 6:21 AM

## 2023-06-01 NOTE — [facility] Group Notes (Signed)
[facility] Group Notes:  (Nursing/MHT/Case Management/Adjunct)  Date:  06/01/2023  Time:  9:17 PM  Type of Therapy:   Wrap-up group  Participation Level:  Active  Participation Quality:  Appropriate  Affect:  Appropriate  Cognitive:  Appropriate  Insight:  Appropriate  Engagement in Group:  Engaged  Modes of Intervention:  Education  Summary of Progress/Problems: Goal to be positive, Pt reports she met goal. Day 8/10.  Noah Delaine 06/01/2023, 9:17 PM

## 2023-06-01 NOTE — Group Note (Signed)
Date:  06/01/2023 Time:  3:42 PM  Group Topic/Focus:  Developing a Wellness Toolbox:   The focus of this group is to help patients develop a "wellness toolbox" with skills and strategies to promote recovery upon discharge. Dimensions of Wellness:   The focus of this group is to introduce the topic of wellness and discuss the role each dimension of wellness plays in total health.    Participation Level:  Active  Participation Quality:  Appropriate and Attentive  Affect:  Appropriate  Cognitive:  Alert and Appropriate  Insight: Appropriate and Good  Engagement in Group:  Engaged and Improving  Modes of Intervention:  Discussion and Education  Additional Comments:  n/a  Cherre Blanc 06/01/2023, 3:42 PM

## 2023-06-01 NOTE — Progress Notes (Signed)
Nursing Note: 0700-1900    Goal for today: "Self and to stay positive."  Pt reports that she slept well last night, appetite is good and rates hopelessness, depression and anxiety 0/10. Denies A/V hallucinations and is able to verbally contract for safety.  Pt. encouraged to verbalize needs and concerns, active listening and support provided.  Continued Q 15 minute safety checks. Observed active participation in group settings.  Received order to start Norvasc 5mg  PO, pts BP at  1700: 139/86 HR 75.  "My blood pressure is getting better each day, my anxiety is less now. Can I wait to take this medication?  Medication held at this time, but pt aware that if BP does not continue to decrease, she needs to take. Pt agreed.   06/01/23 0900  Psych Admission Type (Psych Patients Only)  Admission Status Voluntary  Psychosocial Assessment  Patient Complaints None  Eye Contact Fair  Facial Expression Anxious  Affect Appropriate to circumstance  Speech Logical/coherent  Interaction Assertive  Motor Activity Other (Comment) (Unremarkable.)  Appearance/Hygiene Unremarkable  Behavior Characteristics Cooperative  Mood Pleasant  Thought Process  Coherency WDL  Content WDL  Delusions None reported or observed  Perception WDL  Hallucination None reported or observed  Judgment WDL  Confusion None  Danger to Self  Current suicidal ideation? Denies  Danger to Others  Danger to Others None reported or observed
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
## 2023-06-02 NOTE — Progress Notes (Signed)
Psychiatric Admission Assessment Adult  Patient Identification: Jamie Olson MRN:  [id number]  Date of Evaluation:  06/02/2023  Chief Complaint: Worsening suicidal ideations.  Principal Diagnosis: MDD (major depressive disorder), single episode, severe , no psychosis (HCC)  Diagnosis:  Principal Problem:   MDD (major depressive disorder), single episode, severe , no psychosis (HCC)  History of Present Illness: This is the first psychiatric admission/evaluation in this [facility] for this 47 year old AA female with no known hx of mental health issues, hospitalizations or treatments. Admitted to the [facility] from the [facility] in [place], [place] with chief complaints of feeling depressed & having suicidal ideations while considering to overdose on pills. Patient apparently attempted to jump off of a moving car while being taken to the hospital. The trigger for the depression/suicidal ideations was because patient recently found out that she has contracted genital herpes virus from her boyfriend of 15 years. A review of her toxicology results indicated that patient's blood alcohol level was 86 & her UDS was negative of all other substances.   On Interview 06/02/2023 Patient seen in her room this morning on my approach. The patient was guarded about discussing her recent Herpes diagnosis and some of the events that lead to her hospitalization. She reports being frustrated when she heard the news from her doctor. She is feeling better now and she has been in close contact with her daughter. She feels safe going home and she denies any SI/HI/AVH.   Associated Signs/Symptoms:  Depression Symptoms:   Patient currently denies any symptoms of depression. However, reports feeling a little anxious about when to get discharged from here.   (Hypo) Manic Symptoms:  Impulsivity,  Anxiety Symptoms:   Reports mild anxiety about getting discharged from here.  Psychotic Symptoms:   Patient currently denies any  AVH, delusional thoughts or paranoia. She does not appear to be responding to any internal stimuli.  PTSD Symptoms: Patient denies any PTSD symptoms or hx of events. NA  Total Time spent with patient: 1 hour  Past Psychiatric History: Anxiety disorder.  Is the patient at risk to self? No.  Has the patient been a risk to self in the past 6 months? Yes.    Has the patient been a risk to self within the distant past? No.  Is the patient a risk to others? No.  Has the patient been a risk to others in the past 6 months? No.  Has the patient been a risk to others within the distant past? No.   [place] Scale:  Flowsheet Row [facility] ([facility]) [facility] from 05/29/2023 in [facility] at [facility] ED from 04/09/2021 in [facility] RISK CATEGORY No Risk High Risk Error: Question 6 not populated      Prior Inpatient Therapy: No. If yes, describe: NA   Prior Outpatient Therapy: No. If yes, describe: NA   Alcohol Screening: 1. How often do you have a drink containing alcohol?: Monthly or less 2. How many drinks containing alcohol do you have on a typical day when you are drinking?: 1 or 2 3. How often do you have six or more drinks on one occasion?: Never AUDIT-C Score: 1 4. How often during the last year have you found that you were not able to stop drinking once you had started?: Never 5. How often during the last year have you failed to do what was normally  expected from you because of drinking?: Never 6. How often during the last year have you needed a first drink in the morning to get yourself going after a heavy drinking session?: Never 7. How often during the last year have you had a feeling of guilt of remorse after drinking?: Never 8. How often during the last year have you been unable to remember what happened the night before because you had been drinking?:  Never 9. Have you or someone else been injured as a result of your drinking?: No 10. Has a relative or friend or a doctor or another health worker been concerned about your drinking or suggested you cut down?: No Alcohol Use Disorder Identification Test Final Score (AUDIT): 1  Substance Abuse History in the last 12 months:  Yes.    Consequences of Substance Abuse: Discussed with patient during this admission evaluation. Medical Consequences:  Liver damage, Possible death by overdose Legal Consequences:  Arrests, jail time, Loss of driving privilege. Family Consequences:  Family discord, divorce and or separation.  Previous Psychotropic Medications: Yes   Psychological Evaluations: No   Past Medical History:  Past Medical History:  Diagnosis Date   Anxiety    History reviewed. No pertinent surgical history.  Family History: History reviewed. No pertinent family history.  Family Psychiatric  History: Patient currently denies  Tobacco Screening:  Social History   Tobacco Use  Smoking Status Never  Smokeless Tobacco Never    BH Tobacco Counseling     Are you interested in Tobacco Cessation Medications?  No value filed. Counseled patient on smoking cessation:  No value filed. Reason Tobacco Screening Not Completed: No value filed.       Social History: Reports being single, has 3 children, employed, lives in [place] with daughter. Social History   Substance and Sexual Activity  Alcohol Use Not Currently     Social History   Substance and Sexual Activity  Drug Use Never    Additional Social History:  Allergies:  No Known Allergies  Lab Results:  No results found for this or any previous visit (from the past 48 hour(s)).  Blood Alcohol level:  Lab Results  Component Value Date   ETH 86 (H) 05/29/2023   Metabolic Disorder Labs:  No results found for: "HGBA1C", "MPG" No results found for: "PROLACTIN" No results found for: "CHOL", "TRIG", "HDL", "CHOLHDL",  "VLDL", "LDLCALC"  Current Medications: Current Facility-Administered Medications  Medication Dose Route Frequency Provider Last Rate Last Admin   acetaminophen (TYLENOL) tablet 650 mg  650 mg Oral Q6H PRN Onuoha, Chinwendu V, NP       alum & mag hydroxide-simeth (MAALOX/MYLANTA) 200-200-20 MG/5ML suspension 30 mL  30 mL Oral Q4H PRN Onuoha, Chinwendu V, NP       amLODipine (NORVASC) tablet 5 mg  5 mg Oral Daily Massengill, Nathan, MD   5 mg at 06/02/23 1324   hydrOXYzine (ATARAX) tablet 25 mg  25 mg Oral TID PRN Onuoha, Chinwendu V, NP   25 mg at 06/01/23 2057   LORazepam (ATIVAN) tablet 2 mg  2 mg Oral TID PRN Onuoha, Chinwendu V, NP       Or   LORazepam (ATIVAN) injection 2 mg  2 mg Intramuscular TID PRN Onuoha, Chinwendu V, NP       magnesium hydroxide (MILK OF MAGNESIA) suspension 30 mL  30 mL Oral Daily PRN Onuoha, Chinwendu V, NP       traZODone (DESYREL) tablet 50 mg  50 mg Oral QHS Nwoko,  Nelda Marseille, NP   50 mg at 06/01/23 2057   PTA Medications: Medications Prior to Admission  Medication Sig Dispense Refill Last Dose   busPIRone (BUSPAR) 7.5 MG tablet Take 7.5 mg by mouth 2 (two) times daily.   Past Week   diclofenac (VOLTAREN) 75 MG EC tablet Take 1 tablet by mouth 2 (two) times daily.   Past Month   phentermine (ADIPEX-P) 37.5 MG tablet Take 37.5 mg by mouth daily.   Past Month   Musculoskeletal: Strength & Muscle Tone: within normal limits Gait & Station: normal Patient leans: N/A  Psychiatric Specialty Exam:  Presentation  General Appearance: Appropriate for Environment  Eye Contact:Good  Speech:Normal Rate  Speech Volume:Normal  Handedness:Right   Mood and Affect  Mood:Euthymic  Affect:Appropriate  Thought Process  Thought Processes:Linear  Duration of Psychotic Symptoms: NA  Past Diagnosis of Schizophrenia or Psychoactive disorder: No  Descriptions of Associations:Intact  Orientation:Full (Time, Place and Person)  Thought  Content:Logical  Hallucinations:Hallucinations: None  Ideas of Reference:None  Suicidal Thoughts:Suicidal Thoughts: No  Homicidal Thoughts:Homicidal Thoughts: No  Sensorium  Memory:Immediate Good  Judgment:Good  Insight:Good  Executive Functions  Concentration:Good  Attention Span:Good  Recall:Good  Fund of Knowledge:Fair  Language:Good  Psychomotor Activity  Psychomotor Activity:Psychomotor Activity: Normal  Assets  Assets:Communication Skills; Desire for Improvement; Financial Resources/Insurance; Housing; Physical Health; Social Support  Sleep  Sleep:Sleep: Good  Physical Exam: Physical Exam Vitals and nursing note reviewed.  Eyes:     Conjunctiva/sclera: Conjunctivae normal.  Cardiovascular:     Pulses: Normal pulses.  Pulmonary:     Effort: Pulmonary effort is normal. No respiratory distress.  Genitourinary:    Comments: Deferred Musculoskeletal:        General: Normal range of motion.     Cervical back: Normal range of motion.  Skin:    General: Skin is warm and dry.  Neurological:     General: No focal deficit present.     Mental Status: She is alert and oriented to person, place, and time.  Psychiatric:        Attention and Perception: Attention and perception normal. She does not perceive auditory or visual hallucinations.        Mood and Affect: Mood is anxious and depressed.        Speech: Speech normal.        Behavior: Behavior normal. Behavior is cooperative.        Thought Content: Thought content is not paranoid or delusional. Thought content does not include suicidal ideation.        Judgment: Judgment normal.    Review of Systems  Constitutional:        No complaints voiced  Psychiatric/Behavioral:  Negative for hallucinations. Depression: Stable. Substance abuse: Denies. Suicidal ideas: Denies.The patient does not have insomnia. Nervous/anxious: Stable.  All other systems reviewed and are negative.  Blood pressure (!) 103/90,  pulse (!) 101, temperature 98.5 F (36.9 C), temperature source Oral, resp. rate 18, height 5\' 4"  (1.626 m), weight 91.8 kg, SpO2 100%. Body mass index is 34.74 kg/m.  Treatment Plan Summary: Daily contact with patient to assess and evaluate symptoms and progress in treatment and Medication management.   Principal/active diagnoses. MDD (major depressive disorder), single episode, severe , no psychosis.   Associated symptoms.  Suicidal ideations.  PLAN: Safety and Monitoring: Voluntary admission to inpatient psychiatric unit for safety, stabilization and treatment Daily contact with patient to assess and evaluate symptoms and progress in treatment Patient's case to be discussed  in multi-disciplinary team meeting Observation Level: q15 minute checks Vital signs:  q12 hours Precautions: suicide, elopement, and assault   2. Psychiatric Diagnoses and Treatment:  No psychotropic medications started other than PRN medications.  -Continue Tylenol 650 mg every 6 hours PRN for mild pain -Continue Maalox 30 ml Q 4 hrs PRN for indigestion -Continue MOM 30 ml po Q 6 hrs for constipation              3. Medical Issues Being Addressed:  Labs review notable for urine drug screen positive for cocaine.    4. Discharge Planning:  Social work and case management to assist with discharge planning and identification of hospital follow-up needs prior to discharge Estimated LOS: 5-7 days Discharge Concerns: Need to establish a safety plan; Medication compliance and effectiveness Discharge Goals: Return home with outpatient referrals for mental health follow-up including medication management/psychotherapy Physician Treatment Plan for Primary Diagnosis: MDD (major depressive disorder), single episode, severe , no psychosis (HCC)  Long Term Goal(s): Improvement in symptoms so as ready for discharge  Short Term Goals: Ability to identify changes in lifestyle to reduce recurrence of condition will  improve, Ability to verbalize feelings will improve, Ability to disclose and discuss suicidal ideas, and Ability to demonstrate self-control will improve  Physician Treatment Plan for Secondary Diagnosis: Principal Problem:   MDD (major depressive disorder), single episode, severe , no psychosis (HCC)  Long Term Goal(s): Improvement in symptoms so as ready for discharge  Short Term Goals: Ability to identify and develop effective coping behaviors will improve, Ability to maintain clinical measurements within normal limits will improve, Compliance with prescribed medications will improve, and Ability to identify triggers associated with substance abuse/mental health issues will improve  I certify that inpatient services furnished can reasonably be expected to improve the patient's condition.    Harlin Heys, DO 8/4/202411:05 AM

## 2023-06-02 NOTE — Plan of Care (Signed)
  Problem: Education: Goal: Emotional status will improve Outcome: Progressing   Problem: Activity: Goal: Interest or engagement in activities will improve Outcome: Progressing   

## 2023-06-02 NOTE — Progress Notes (Signed)
   06/02/23 0900  Psych Admission Type (Psych Patients Only)  Admission Status Voluntary/72 hour document signed  Date 72 hour document signed  05/31/23  Time 72 hour document signed  1130  Psychosocial Assessment  Patient Complaints None  Eye Contact Fair  Facial Expression Animated  Affect Appropriate to circumstance  Speech Logical/coherent  Interaction Assertive  Motor Activity Other (Comment) (wnl)  Appearance/Hygiene Unremarkable  Behavior Characteristics Appropriate to situation  Mood Pleasant  Thought Process  Coherency WDL  Content WDL  Delusions None reported or observed  Perception WDL  Hallucination None reported or observed  Judgment WDL  Confusion None  Danger to Self  Current suicidal ideation? Denies  Danger to Others  Danger to Others None reported or observed

## 2023-06-02 NOTE — [facility] Group Notes (Signed)
[facility] Group Notes:  (Nursing)  Date:  06/02/2023  Time: 1400 Type of Therapy:  Psychoeducational Skills  Participation Level:  Active  Participation Quality:  Attentive  Affect:  Appropriate  Cognitive:  Alert and Appropriate  Insight:  Appropriate and Good  Engagement in Group:  Engaged and Supportive  Modes of Intervention:  Discussion, Exploration, Problem-solving, Rapport Building, Socialization, and Support  Summary of Progress/Problems: Group played a noncompetitive game that encourages people to share thoughts, feelings, and ideas as they explore a variety of topics Shela Nevin 06/02/2023, 4:17 PM

## 2023-06-02 NOTE — Group Note (Signed)
LCSW Group Therapy Note   Group Date: 06/02/2023 Start Time: 1000 End Time: 1100   Type of Therapy and Topic:  Group Therapy: Gratitude  Participation Level:  Minimal   Description of Group:   In this group, patients shared and discussed the importance of acknowledging the elements in their lives for which they are grateful and how this can positively impact their mood.  The group discussed how bringing the positive elements of their lives to the forefront of their minds can help with recovery from any illness, physical or mental.  An Gratitude exercise was done as a group in which a list was made of songs used to  encourage participants to consider other potential positives in their lives.  Therapeutic Goals: Patients will identify one or more item for which they are grateful in each of 6 categories:  people, experiences, things, places, skills, and other. Patients will discuss how it is possible to seek out gratitude in even bad situations. Patients will explore other possible items of gratitude that they could remember.   Summary of Patient Progress:  The patient shared that she is grateful for her 2 daughters graduating college this year, being around other people and hearing their stories.  Patient's reaction to the group was reflective, she was reserve just listening to discussion.   Therapeutic Modalities:   Solution-Focused Therapy Activity  Steffanie Dunn, LCSWA 06/02/2023  2:24 PM

## 2023-06-02 NOTE — Group Note (Signed)
Date:  06/02/2023 Time:  10:22 AM  Group Topic/Focus:  Goals Group:   The focus of this group is to help patients establish daily goals to achieve during treatment and discuss how the patient can incorporate goal setting into their daily lives to aide in recovery.    Participation Level:  Active  Participation Quality:  Appropriate  Affect:  Appropriate  Cognitive:  Appropriate  Insight: Appropriate  Engagement in Group:  Engaged  Modes of Intervention:  Discussion  Additional Comments:     Reymundo Poll 06/02/2023, 10:22 AM

## 2023-06-03 MED ORDER — AMLODIPINE BESYLATE 5 MG PO TABS: 5.0000 mg | ORAL_TABLET | Freq: Every day | ORAL | 0 refills | Status: AC

## 2023-06-03 NOTE — Discharge Instructions (Signed)
-  Follow-up with your outpatient psychiatric provider -instructions on appointment date, time, and address (location) are provided to you in discharge paperwork.  -Follow-up with outpatient primary care doctor and other specialists -for management of preventative medicine and any chronic medical disease.  -Recommend abstinence from alcohol, tobacco, and other illicit drug use at discharge.   -If your psychiatric symptoms recur, worsen, or if you have side effects to your psychiatric medications, call your outpatient psychiatric provider, 911, [phone] or go to the nearest emergency department.  -If suicidal thoughts occur, call your outpatient psychiatric provider, 911, 988 or go to the nearest emergency department.  Naloxone (Narcan) can help reverse an overdose when given to the victim quickly.  [place] offers free naloxone kits and instructions/training on its use.  Add naloxone to your first aid kit and you can help save a life.   Pick up your free kit at the following locations:   [place]:  [facility], [address] [place] [address] ([phone]) [facility] [address] [place] [id number] ([phone]) [facility] [address] [place] [address]  [place]: [facility] [address] [place] [address] ([phone]) [facility] [address] [place] [address] ([phone])  

## 2023-06-03 NOTE — Progress Notes (Signed)
  [facility] Adult Case Management Discharge Plan :  Will you be returning to the same living situation after discharge:  Yes,  with her daughter Christianne Borrow) At discharge, do you have transportation home?: Yes,  daughter will pick her up ( Catoria) Do you have the ability to pay for your medications: Yes,  has insurance  Release of information consent forms completed and in the chart;  Patient's signature needed at discharge.  Patient to Follow up at:  Follow-up Information     [facility] Follow up.   Why: You have a hospital follow up appointment for therapy and medication management services on                            *  Physical Office location:  [facility], [address] [place], [place] [address] Contact information: [address]  [address] [place] [address] [phone]                 Next level of care provider has access to [facility] Link:no  Safety Planning and Suicide Prevention discussed: Yes,  Catoria (daughter)     Has patient been referred to the Quitline?: Patient refused referral for treatment  Patient has been referred for addiction treatment: Patient refused referral for treatment.  Marinda Elk, LCSW 06/03/2023, 9:42 AM

## 2023-06-03 NOTE — Progress Notes (Signed)
   06/03/23 1509  Psych Admission Type (Psych Patients Only)  Admission Status Voluntary/72 hour document signed  Date 72 hour document signed  05/31/23  Time 72 hour document signed  1130  Provider Notified (First and Last Name) (see details for LINK to note) Massengill MD at 0830  Psychosocial Assessment  Patient Complaints None  Eye Contact Fair  Facial Expression Animated  Affect Appropriate to circumstance  Speech Logical/coherent  Interaction Assertive  Motor Activity Other (Comment) (unremarkable)  Appearance/Hygiene Unremarkable  Behavior Characteristics Appropriate to situation  Mood Anxious  Thought Process  Coherency WDL  Content WDL  Delusions None reported or observed  Perception WDL  Hallucination None reported or observed  Judgment WDL  Confusion None  Danger to Self  Current suicidal ideation? Denies  Agreement Not to Harm Self Yes  Description of Agreement verbal  Danger to Others  Danger to Others None reported or observed

## 2023-06-03 NOTE — Progress Notes (Signed)
   06/02/23 2200  Psych Admission Type (Psych Patients Only)  Admission Status Voluntary/72 hour document signed  Date 72 hour document signed  05/31/23  Time 72 hour document signed  1130  Psychosocial Assessment  Patient Complaints None  Eye Contact Fair  Facial Expression Animated  Affect Appropriate to circumstance  Speech Logical/coherent  Interaction Assertive  Motor Activity Other (Comment)  Appearance/Hygiene Unremarkable  Behavior Characteristics Appropriate to situation  Mood Pleasant  Thought Process  Coherency WDL  Content WDL  Delusions None reported or observed  Perception WDL  Hallucination None reported or observed  Judgment WDL  Confusion None  Danger to Self  Current suicidal ideation? Denies  Danger to Others  Danger to Others None reported or observed

## 2023-06-03 NOTE — [facility] Group Notes (Signed)
Adult Psychoeducational Group Note  Date:  06/03/2023 Time:  3:15 PM  Group Topic/Focus:  Goals Group:   The focus of this group is to help patients establish daily goals to achieve during treatment and discuss how the patient can incorporate goal setting into their daily lives to aide in recovery. Managing Feelings:   The focus of this group is to identify what feelings patients have difficulty handling and develop a plan to handle them in a healthier way upon discharge.  Participation Level:  Active  Participation Quality:  Appropriate  Affect:  Appropriate  Cognitive:  Appropriate  Insight: Appropriate  Engagement in Group:  Engaged  Modes of Intervention:  Discussion, Education, and Exploration  Additional Comments:  Pt participated in group. Pt stated her goal is to register for classes in order to continue her education. Pt actively listened as group discussed self-worth, self-awareness. Pt's discussed the barriers faced when discharging along with the emotions, feelings and the uncertainty.       Thelma Lorenzetti 06/03/2023, 3:15 PM

## 2023-06-03 NOTE — Group Note (Signed)
Recreation Therapy Group Note   Group Topic:Relaxation  Group Date: 06/03/2023 Start Time: 0945 End Time: 1005 Facilitators: Camryn Quesinberry-McCall, LRT,CTRS Location: [address] Dayroom   Goal Area(s) Addresses:  Patient will identify positive stress management techniques. Patient will identify benefits of using stress management post d/c.  Group Description: Meditation. LRT discussed meditation with group. LRT then played a meditation that focused identifying those things that are holding you back and how to overcome them. Patients were to get as comfortable as possible and focus on the content of the meditation. LRT informed patients they could find other meditations and stress management techniques from apps, Youtube, scripts, etc.   Affect/Mood: Appropriate   Participation Level: Engaged   Participation Quality: Independent   Behavior: Appropriate   Speech/Thought Process: Focused   Insight: Good   Judgement: Good   Modes of Intervention: Meditation   Patient Response to Interventions:  Engaged   Education Outcome:  Acknowledges education   Clinical Observations/Individualized Feedback: Pt attended and participated in group session.     Plan: Continue to engage patient in RT group sessions 2-3x/week.   Noha Karasik-McCall, LRT,CTRS  06/03/2023 12:37 PM

## 2023-06-03 NOTE — Progress Notes (Signed)
   06/03/23 0630  15 Minute Checks  Location Hallway  Visual Appearance Calm  Behavior Composed  Sleep (Behavioral Health Patients Only)  Calculate sleep? (Click Yes once per 24 hr at 0600 safety check) Yes  Documented sleep last 24 hours 7.75

## 2023-06-03 NOTE — BHH Suicide Risk Assessment (Signed)
[facility] Discharge Suicide Risk Assessment   Principal Problem: MDD (major depressive disorder), single episode, severe , no psychosis (HCC) Discharge Diagnoses: Principal Problem:   MDD (major depressive disorder), single episode, severe , no psychosis (HCC)   Total Time spent with patient: 20 minutes  This is the first psychiatric admission/evaluation in this [facility] for this 47 year old AA female with no known hx of mental health issues, hospitalizations or treatments. Admitted to the [facility] from the [facility] in [place], [place] with chief complaints of feeling depressed & having suicidal ideations while considering to overdose on pills. Patient apparently attempted to jump off of a moving car while being taken to the hospital. The trigger for the depression/suicidal ideations was because patient recently found out that she has contracted genital herpes virus from her boyfriend of 15 years. A review of her toxicology results indicated that patient's blood alcohol level was 86 & her UDS was negative of all other substances.    Psychiatric diagnoses provided upon initial assessment:  MDD   Patient's psychiatric medications were adjusted on admission:  Pt declined any psychiatric medication   During the hospitalization, other adjustments were made to the patient's psychiatric medication regimen: none   Pt signed 72 hr request for dc on 8-2. She did not meet criteria for IVC in [place], and was dc prior to expiration of 72 hr request. Patient's care was discussed during the interdisciplinary team meeting every day during the hospitalization.   On dc, pt does not want any sleep medication (trazodone) or anxiety medications (hydroxyzine).   Psychiatric Specialty Exam  Presentation  General Appearance:  Appropriate for Environment; Casual; Fairly Groomed  Eye Contact: Good  Speech: Normal Rate; Clear and Coherent  Speech Volume: Normal  Handedness: Right   Mood and Affect   Mood: Euthymic  Duration of Depression Symptoms: Greater than two weeks  Affect: Appropriate; Congruent; Full Range   Thought Process  Thought Processes: Linear  Descriptions of Associations:Intact  Orientation:Full (Time, Place and Person)  Thought Content:Logical  History of Schizophrenia/Schizoaffective disorder:No  Duration of Psychotic Symptoms:No data recorded Hallucinations:Hallucinations: None  Ideas of Reference:None  Suicidal Thoughts:Suicidal Thoughts: No  Homicidal Thoughts:Homicidal Thoughts: No   Sensorium  Memory: Immediate Good; Recent Good; Remote Good  Judgment: Fair  Insight: Fair   Art therapist  Concentration: Fair  Attention Span: Fair  Recall: Good  Fund of Knowledge: Good  Language: Good   Psychomotor Activity  Psychomotor Activity: Psychomotor Activity: Normal   Assets  Assets: Communication Skills; Desire for Improvement; Financial Resources/Insurance; Housing; Physical Health; Social Support   Sleep  Sleep: Sleep: Fair   Physical Exam: Physical Exam See discharge summary  ROS See discharge summary  Blood pressure 119/83, pulse (!) 102, temperature 98.2 F (36.8 C), temperature source Oral, resp. rate 18, height 5\' 4"  (1.626 m), weight 91.8 kg, SpO2 100%. Body mass index is 34.74 kg/m.  Mental Status Per Nursing Assessment::   On Admission:  NA  Demographic factors:  NA   Loss Factors:  NA   Historical Factors:  NA   Risk Reduction Factors:  Living with another person, especially a relative, Positive social support  Continued Clinical Symptoms:  Mood is stable. Denying SI.   Cognitive Features That Contribute To Risk:  None    Suicide Risk:  Mild:  There are no identifiable suicide plans, no associated intent, mild dysphoria and related symptoms, good self-control (both objective and subjective assessment), few other risk factors, and identifiable protective factors, including  available and accessible social support.    Follow-up Information     [facility] Follow up.   Why: You have a hospital follow up appointment for therapy and medication management services on                            *  Physical Office location:  [facility], [address] [place], [place] [address] Contact information: [address]  [address] [place] [address] [phone]                 Plan Of Care/Follow-up recommendations:   Follow-up with your outpatient psychiatric provider -instructions on appointment date, time, and address (location) are provided to you in discharge paperwork.   -Take your psychiatric medications as prescribed at discharge - instructions are provided to you in the discharge paperwork   -Follow-up with outpatient primary care doctor and other specialists -for management of preventative medicine and chronic medical disease   -If you are prescribed an atypical antipsychotic medication, we recommend that your outpatient psychiatrist follow routine screening for side effects within 3 months of discharge, including monitoring: AIMS scale, height, weight, blood pressure, fasting lipid panel, HbA1c, and fasting blood sugar.    -Recommend total abstinence from alcohol, tobacco, and other illicit drug use at discharge.    -If your psychiatric symptoms recur, worsen, or if you have side effects to your psychiatric medications, call your outpatient psychiatric provider, 911, 988 or go to the nearest emergency department.   -If suicidal thoughts occur, immediately call your outpatient psychiatric provider, 911, 988 or go to the nearest emergency department.     Cristy Hilts, MD 06/03/2023, 9:28 AM

## 2023-06-03 NOTE — Plan of Care (Signed)
  Problem: Education: Goal: Knowledge of [facility] information/materials will improve Outcome: Progressing   Problem: Education: Goal: Mental status will improve Outcome: Progressing   Problem: Education: Goal: Verbalization of understanding the information provided will improve Outcome: Progressing   Problem: Activity: Goal: Interest or engagement in activities will improve Outcome: Progressing   Problem: Safety: Goal: Periods of time without injury will increase Outcome: Progressing   Problem: Education: Goal: Ability to state activities that reduce stress will improve Outcome: Progressing

## 2023-06-03 NOTE — Progress Notes (Signed)
Pt discharged alert and oriented x 4 and cooperative. Belongings returned to pt and discharge instructions reviewed with pt.

## 2023-06-03 NOTE — Discharge Summary (Signed)
Physician Discharge Summary Note  Patient:  Jamie Olson is an 47 y.o., female MRN:  [id number] DOB:  Oct 15, 1976 Patient phone:  [phone] (home)  Patient address:   [address] [place] [address],  Total Time spent with patient: 20 minutes  Date of Admission:  05/30/2023 Date of Discharge: 06-03-2023  Reason for Admission:   This is the first psychiatric admission/evaluation in this [facility] for this 47 year old AA female with no known hx of mental health issues, hospitalizations or treatments. Admitted to the [facility] from the [facility] in [place], [place] with chief complaints of feeling depressed & having suicidal ideations while considering to overdose on pills. Patient apparently attempted to jump off of a moving car while being taken to the hospital. The trigger for the depression/suicidal ideations was because patient recently found out that she has contracted genital herpes virus from her boyfriend of 15 years. A review of her toxicology results indicated that patient's blood alcohol level was 86 & her UDS was negative of all other substances.   Principal Problem: MDD (major depressive disorder), single episode, severe , no psychosis (HCC) Discharge Diagnoses: Principal Problem:   MDD (major depressive disorder), single episode, severe , no psychosis (HCC)   Past Psychiatric History:   Anxiety disorder.  Prior Inpatient Therapy: No. If yes, describe: NA  Prior Outpatient Therapy: No. If yes, describe: NA    Past Medical History:  Past Medical History:  Diagnosis Date   Anxiety    History reviewed. No pertinent surgical history. Family History: History reviewed. No pertinent family history.  Family Psychiatric  History: See H&P  Social History:  Social History   Substance and Sexual Activity  Alcohol Use Not Currently     Social History   Substance and Sexual Activity  Drug Use Never    Social History   Socioeconomic History   Marital status:  Single    Spouse name: Not on file   Number of children: Not on file   Years of education: Not on file   Highest education level: Not on file  Occupational History   Not on file  Tobacco Use   Smoking status: Never   Smokeless tobacco: Never  Substance and Sexual Activity   Alcohol use: Not Currently   Drug use: Never   Sexual activity: Not on file  Other Topics Concern   Not on file  Social History Narrative   Not on file   Social Determinants of Health   Financial Resource Strain: Not on File (06/21/2018)   Received from [employer]    Financial Resource Strain: 0  Food Insecurity: No Food Insecurity (05/30/2023)   Hunger Vital Sign    Worried About Running Out of Food in the Last Year: Never true    Ran Out of Food in the Last Year: Never true  Transportation Needs: No Transportation Needs (05/30/2023)   PRAPARE - Administrator, Civil Service (Medical): No    Lack of Transportation (Non-Medical): No  Physical Activity: Not on File (06/21/2018)   [facility]   Physical Activity    Physical Activity: 0  Stress: Not on File (06/21/2018)   Received from [facility]   Stress    Stress: 0  Social Connections: Not on File (06/21/2018)   Received from [facility] -    Social Connections    Social Connections and Isolation: 0    Hospital Course:   During the patient's hospitalization, patient had extensive initial  psychiatric evaluation, and follow-up psychiatric evaluations every day.  Psychiatric diagnoses provided upon initial assessment:  MDD  Patient's psychiatric medications were adjusted on admission:  Pt declined any psychiatric medication  During the hospitalization, other adjustments were made to the patient's psychiatric medication regimen: none  Pt signed 72 hr request for dc on 8-2. She did not meet criteria for IVC in [place], and was dc prior to expiration of 72 hr request. Patient's care was discussed during the interdisciplinary team  meeting every day during the hospitalization.  On dc, pt does not want any sleep medication (trazodone) or anxiety medications (hydroxyzine).  Gradually, patient started adjusting to milieu. The patient was evaluated each day by a clinical provider to ascertain response to treatment. Improvement was noted by the patient's report of decreasing symptoms, improved sleep and appetite, affect, medication tolerance, behavior, and participation in unit programming.  Patient was asked each day to complete a self inventory noting mood, mental status, pain, new symptoms, anxiety and concerns.    Symptoms were reported as significantly decreased or resolved completely by discharge.   On day of discharge, the patient reports that their mood is stable. The patient denied having suicidal thoughts for more than 48 hours prior to discharge.  Patient denies having homicidal thoughts.  Patient denies having auditory hallucinations.  Patient denies any visual hallucinations or other symptoms of psychosis. The patient was motivated to continue taking medication with a goal of continued improvement in mental health.   The patient reports their target psychiatric symptoms of suicidal thoughts responded well to the psychiatric medications, and the patient reports overall benefit other psychiatric hospitalization. Supportive psychotherapy was provided to the patient. The patient also participated in regular group therapy while hospitalized. Coping skills, problem solving as well as relaxation therapies were also part of the unit programming.  Labs were reviewed with the patient, and abnormal results were discussed with the patient.  The patient is able to verbalize their individual safety plan to this provider.  # It is recommended to the patient to continue psychiatric medications as prescribed, after discharge from the hospital.    # It is recommended to the patient to follow up with your outpatient psychiatric provider  and PCP.  # It was discussed with the patient, the impact of alcohol, drugs, tobacco have been there overall psychiatric and medical wellbeing, and total abstinence from substance use was recommended the patient.ed.  # Prescriptions provided or sent directly to preferred pharmacy at discharge. Patient agreeable to plan. Given opportunity to ask questions. Appears to feel comfortable with discharge.    # In the event of worsening symptoms, the patient is instructed to call the crisis hotline, 911 and or go to the nearest ED for appropriate evaluation and treatment of symptoms. To follow-up with primary care provider for other medical issues, concerns and or health care needs  # Patient was discharged home with daughter, with a plan to follow up as noted below.   Physical Findings: AIMS:  , ,  ,  ,    CIWA:    COWS:     Musculoskeletal: Strength & Muscle Tone: within normal limits Gait & Station: normal Patient leans: N/A   Psychiatric Specialty Exam:  Presentation  General Appearance:  Appropriate for Environment; Casual; Fairly Groomed  Eye Contact: Good  Speech: Normal Rate; Clear and Coherent  Speech Volume: Normal  Handedness: Right   Mood and Affect  Mood: Euthymic  Affect: Appropriate; Congruent; Full Range   Thought Process  Thought Processes: Linear  Descriptions of Associations:Intact  Orientation:Full (Time, Place and Person)  Thought Content:Logical  History of Schizophrenia/Schizoaffective disorder:No  Duration of Psychotic Symptoms:No data recorded Hallucinations:Hallucinations: None  Ideas of Reference:None  Suicidal Thoughts:Suicidal Thoughts: No  Homicidal Thoughts:Homicidal Thoughts: No   Sensorium  Memory: Immediate Good; Recent Good; Remote Good  Judgment: Fair  Insight: Fair   Art therapist  Concentration: Fair  Attention Span: Fair  Recall: Good  Fund of  Knowledge: Good  Language: Good   Psychomotor Activity  Psychomotor Activity: Psychomotor Activity: Normal   Assets  Assets: Communication Skills; Desire for Improvement; Financial Resources/Insurance; Housing; Physical Health; Social Support   Sleep  Sleep: Sleep: Fair    Physical Exam: Physical Exam Vitals reviewed.  Constitutional:      Appearance: She is normal weight.  Pulmonary:     Effort: Pulmonary effort is normal.  Neurological:     Mental Status: She is alert.     Motor: No weakness.     Gait: Gait normal.  Psychiatric:        Mood and Affect: Mood normal.        Behavior: Behavior normal.        Thought Content: Thought content normal.        Judgment: Judgment normal.    Review of Systems  Constitutional:  Negative for chills and fever.  Cardiovascular:  Negative for chest pain and palpitations.  Neurological:  Negative for dizziness, tingling, tremors and headaches.  Psychiatric/Behavioral:  Negative for depression, hallucinations, memory loss, substance abuse and suicidal ideas. The patient is not nervous/anxious and does not have insomnia.   All other systems reviewed and are negative.  Blood pressure 119/83, pulse (!) 102, temperature 98.2 F (36.8 C), temperature source Oral, resp. rate 18, height 5\' 4"  (1.626 m), weight 91.8 kg, SpO2 100%. Body mass index is 34.74 kg/m.   Social History   Tobacco Use  Smoking Status Never  Smokeless Tobacco Never   Tobacco Cessation:  N/A, patient does not currently use tobacco products   Blood Alcohol level:  Lab Results  Component Value Date   ETH 86 (H) 05/29/2023    Metabolic Disorder Labs:  No results found for: "HGBA1C", "MPG" No results found for: "PROLACTIN" No results found for: "CHOL", "TRIG", "HDL", "CHOLHDL", "VLDL", "LDLCALC"  See Psychiatric Specialty Exam and Suicide Risk Assessment completed by Attending Physician prior to discharge.  Discharge destination:  Home  Is  patient on multiple antipsychotic therapies at discharge:  No   Has Patient had three or more failed trials of antipsychotic monotherapy by history:  No  Recommended Plan for Multiple Antipsychotic Therapies: NA  Discharge Instructions     Diet - low sodium heart healthy   Complete by: As directed    Increase activity slowly   Complete by: As directed       Allergies as of 06/03/2023   No Known Allergies      Medication List     STOP taking these medications    busPIRone 7.5 MG tablet Commonly known as: BUSPAR   diclofenac 75 MG EC tablet Commonly known as: VOLTAREN   phentermine 37.5 MG tablet Commonly known as: ADIPEX-P       TAKE these medications      Indication  amLODipine 5 MG tablet Commonly known as: NORVASC Take 1 tablet (5 mg total) by mouth daily. Start taking on: June 04, 2023  Indication: High Blood Pressure Disorder        Follow-up  Information     [facility] Follow up.   Why: You have a hospital follow up appointment for therapy and medication management services on                            *  Physical Office location:  [facility], [address], [place], [place] [address] Contact information: [address]  [address] [place] [address] [phone]                 Follow-up recommendations:    Activity: as tolerated  Diet: heart healthy  Other: -Follow-up with your outpatient psychiatric provider -instructions on appointment date, time, and address (location) are provided to you in discharge paperwork.  -Take your psychiatric medications as prescribed at discharge - instructions are provided to you in the discharge paperwork  -Follow-up with outpatient primary care doctor and other specialists -for management of preventative medicine and chronic medical disease  -If you are prescribed an atypical antipsychotic medication, we recommend that your outpatient psychiatrist follow routine screening for side  effects within 3 months of discharge, including monitoring: AIMS scale, height, weight, blood pressure, fasting lipid panel, HbA1c, and fasting blood sugar.   -Recommend total abstinence from alcohol, tobacco, and other illicit drug use at discharge.   -If your psychiatric symptoms recur, worsen, or if you have side effects to your psychiatric medications, call your outpatient psychiatric provider, 911, [phone] or go to the nearest emergency department.  -If suicidal thoughts occur, immediately call your outpatient psychiatric provider, 911, 988 or go to the nearest emergency department.    Signed: Cristy Hilts, MD 06/03/2023, 9:22 AM  Total Time Spent in Direct Patient Care:  I personally spent 35 minutes on the unit in direct patient care. The direct patient care time included face-to-face time with the patient, reviewing the patient's chart, communicating with other professionals, and coordinating care. Greater than 50% of this time was spent in counseling or coordinating care with the patient regarding goals of hospitalization, psycho-education, and discharge planning needs.   Phineas Inches, MD Psychiatrist

## 2023-07-02 ENCOUNTER — Encounter (HOSPITAL_COMMUNITY): Payer: Self-pay | Admitting: *Deleted

## 2023-07-02 ENCOUNTER — Other Ambulatory Visit: Payer: Self-pay

## 2023-07-02 ENCOUNTER — Emergency Department (HOSPITAL_COMMUNITY)
Admission: EM | Admit: 2023-07-02 | Discharge: 2023-07-02 | Disposition: A | Discharge status: Home / Self Care | Payer: BC Managed Care – PPO | Attending: Emergency Medicine | Admitting: Emergency Medicine

## 2023-07-02 DIAGNOSIS — R21 Rash and other nonspecific skin eruption: Secondary | ICD-10-CM | POA: Diagnosis present

## 2023-07-02 MED ORDER — PREDNISONE 10 MG PO TABS: ORAL_TABLET | ORAL | 0 refills | Status: AC

## 2023-07-02 MED ORDER — PREDNISONE 50 MG PO TABS
60.0000 mg | ORAL_TABLET | Freq: Once | ORAL | Status: AC
  Administered 2023-07-02: 60 mg via ORAL
  Filled 2023-07-02: qty 1

## 2023-07-02 NOTE — ED Notes (Signed)
Rash to both arms x1 weeks Denies on core or legs Denies breathing difficulty

## 2023-07-02 NOTE — ED Provider Notes (Signed)
[place] EMERGENCY DEPARTMENT AT [facility] Provider Note   CSN: [id number] Arrival date & time: 07/02/23  1725     History  Chief Complaint  Patient presents with   Rash    Jamie Olson is a 47 y.o. female presenting for evaluation of an itchy rash to her bilateral forearms for approximately the last 2 weeks.  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  She is unsure if this glove is latex, but to her knowledge is not latex allergic.  She denies shortness of breath, no other complaints.  Her rash is isolated to her bilateral forearms.  She has had no treatment prior to arrival.  The history is provided by the patient.       Home Medications Prior to Admission medications   Medication Sig Start Date End Date Taking? Authorizing Provider  predniSONE (DELTASONE) 10 MG tablet 6, 5, 4, 3, 2 then 1 tablet by mouth daily for 6 days total. 07/02/23  Yes Rosenda Geffrard, Raynelle Fanning, PA-C  amLODipine (NORVASC) 5 MG tablet Take 1 tablet (5 mg total) by mouth daily. 06/04/23 07/04/23  Phineas Inches, MD      Allergies    Patient has no known allergies.    Review of Systems   Review of Systems  Constitutional:  Negative for chills and fever.  Respiratory:  Negative for shortness of breath and wheezing.   Skin:  Positive for rash.  Neurological:  Negative for numbness.  All other systems reviewed and are negative.   Physical Exam Updated Vital Signs BP (!) 166/90 (BP Location: Right Arm)   Pulse 87   Temp 98.5 F (36.9 C) (Oral)   Resp 18   Ht 5\' 4"  (1.626 m)   Wt 92.5 kg   LMP 06/21/2023 (Approximate)   SpO2 100%   BMI 35.02 kg/m  Physical Exam Constitutional:      General: She is not in acute distress.    Appearance: She is well-developed.  HENT:      Head: Normocephalic.  Cardiovascular:     Rate and Rhythm: Normal rate.  Pulmonary:     Effort: Pulmonary effort is normal.     Breath sounds: No wheezing.  Musculoskeletal:        General: Normal range of motion.     Cervical back: Neck supple.  Skin:    Findings: Rash present.     Comments: Find sandpaper quality rash to bilateral forearms, predominantly volar but it does extend to her right extensor surface.  Rash is intact, no vesicles, papules or pustules.     ED Results / Procedures / Treatments   Labs (all labs ordered are listed, but only abnormal results are displayed) Labs Reviewed - No data to display  EKG None  Radiology No results found.  Procedures Procedures    Medications Ordered in ED Medications  predniSONE (DELTASONE) tablet 60 mg (has no administration in time range)    ED Course/ Medical Decision Making/ A&P                                 Medical Decision Making Patient with forearm rash bilaterally since starting a new job where she  has to wear employer provided cotton clothing with a plastic glove covering, suggesting this is a contact dermatitis from this exposure.  Symptoms are limited to her bilateral forearms.  She has no systemic symptoms, no hives.  She has been placed on a prednisone taper, also recommended either Benadryl cream or Goldbond anti-itch cream topically for symptom relief.  As needed follow-up anticipated.  Also discussed whether she might be able to switch to a different kind of glob to see if that will improve her symptoms assuming this is a contact dermatitis from this exposure.  She states she will explore this.  [place] the [place], she is given referrals for locating primary medical care.  Risk Prescription drug management.           Final Clinical Impression(s) / ED Diagnoses Final diagnoses:  Rash    Rx / DC Orders ED Discharge Orders          Ordered    predniSONE (DELTASONE) 10 MG tablet         07/02/23 1813              Burgess Amor, PA-C 07/02/23 Cinda Quest T, DO 07/03/23 1001

## 2023-07-02 NOTE — ED Triage Notes (Signed)
Pt with fine rash to bilateral arms that itches since Wednesday.  Pt denies any new soap or detergents, denies any new foods or medication.

## 2023-07-02 NOTE — Discharge Instructions (Signed)
As discussed I suspect your rash may be secondary to some been you are coming in contact with at your new job, possibly the gloves you are wearing.  You might want to consider a trial with a different kind of glove to see if your symptoms improve.  You are being prescribed some prednisone and have received your first dose here, start taking the prescribed tablets tomorrow, you may take all tablets for the day at 1 time.  Also recommend either a topical Benadryl cream or Goldbond anti-itch cream to help you with the itching symptom.  I have provided you with several local physicians who may be accepting new patients in the event you are interested in establishing care with a primary MD.

## 2023-10-01 NOTE — Therapy (Signed)
OUTPATIENT PHYSICAL THERAPY THORACOLUMBAR EVALUATION   Patient Name: Jamie Olson MRN: [id number] DOB:May 09, 1976, 47 y.o., female Today's Date: 10/02/2023  END OF SESSION:  PT End of Session - 10/02/23 1523     Visit Number 1    Number of Visits 12    Date for PT Re-Evaluation 11/27/23    PT Start Time 1430    PT Stop Time 1514    PT Time Calculation (min) 44 min    Activity Tolerance Patient tolerated treatment well    Behavior During Therapy [facility] for tasks assessed/performed             Past Medical History:  Diagnosis Date   Anxiety    History reviewed. No pertinent surgical history. Patient Active Problem List   Diagnosis Date Noted   MDD (major depressive disorder), single episode, severe , no psychosis (HCC) 05/30/2023    PCP: none   REFERRING PROVIDER: Madaline Brilliant, DO   REFERRING DIAG: M54.9 (ICD-10-CM) - Back pain   Rationale for Evaluation and Treatment: Rehabilitation  THERAPY DIAG:  Other low back pain  Muscle weakness (generalized)  ONSET DATE: chronic pain > 1 year  SUBJECTIVE:                                                                                                                                                                                           SUBJECTIVE STATEMENT: She complaints of right lower back pain which radiates into the right leg but can also be into the left leg with tingling into her feet. She has had 2 injections for it. 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 She is on leave of absence from this until Jan 27th.  PERTINENT HISTORY:  Depression, anx, chronic back pain  PAIN:  NPRS scale: 6-7/10 upon arrival Pain location:low back and down legs, can switch sides on which leg it goes down Pain description: constant, tingling and tightness Aggravating factors: walking too much, bending,  standing too much Relieving factors: child pose stretch, sometimes heat   PRECAUTIONS: None  RED FLAGS: Bowel or bladder incontinence: No   WEIGHT BEARING RESTRICTIONS: No  FALLS:  Has patient fallen in last 6 months? No   OCCUPATION: warehouse work  PLOF: Independent  PATIENT GOALS: reduce pain  NEXT MD VISIT: 11/25/23  OBJECTIVE:  Note: Objective measures were completed at Evaluation unless otherwise noted.  DIAGNOSTIC FINDINGS:  Per MD note 09/16/23 MRI showing "moderate to severe right foraminal narrowing L4-L5 and widening of the L4-L5 facet joints  bilaterally."  PATIENT SURVEYS:  Eval: FOTO 47% functional, goal is 60%   COGNITION: Overall cognitive status: Within functional limits for tasks assessed     SENSATION: WFL   PALPATION: Tender and painful in bilat lumbar P.S, QL, pirformis, superior glutes.  LUMBAR ROM:   AROM eval  Flexion WNL but pain in standing, did feel better with repeated flexion in sitting X10  Extension WNL but more pain after repeated extensions X 10  Right lateral flexion WNL but pain  Left lateral flexion WNL but pain  Right rotation WNL but pain  Left rotation WNL but pain   (Blank rows = not tested)  LOWER EXTREMITY ROM:   WNL bilat    LOWER EXTREMITY MMT:    MMT in sitting Right eval Left eval  Hip flexion 4 5  Hip extension    Hip abduction 4 5  Hip adduction    Hip internal rotation    Hip external rotation    Knee flexion 4 5  Knee extension 5 5  Ankle dorsiflexion 4 4  Ankle plantarflexion    Ankle inversion    Ankle eversion     (Blank rows = not tested)  LUMBAR SPECIAL TESTS:  Straight leg raise test: Positive and Slump test: Positive  Pain with long axis distraction  GAIT: Eval Comments: WNL but painful with prolonged walking.   TODAY'S TREATMENT:  Eval HEP creation and review with demonstration and trial set preformed, see below for details  Manual therapy performed by [name], PT,DPT  for active compression and skilled palpation and Trigger Point Dry-Needling  Treatment instructions: Expect mild to moderate muscle soreness. Patient Consent Given: Yes Education handout provided: verbally provided Muscles treated: bilat Lumbar paraspinals, QL and multifidi, superior glutes Estim combined: Yes milli amp current at frequency 10 to level tolerated Treatment response/outcome: good overall tolerance,twitch response noted      PATIENT EDUCATION: Education details: HEP, PT plan of care, DN  Person educated: Patient Education method: Explanation, Demonstration, Verbal cues, and Handouts Education comprehension: verbalized understanding and needs further education   HOME EXERCISE PROGRAM: Access Code: [email or web address] URL: [email or web address] Date: 10/02/2023 Prepared by: [name]  Exercises - Hooklying Single Knee to Chest Stretch  - 2 x daily - 6 x weekly - 1 sets - 2 reps - 30 sec hold - Supine Piriformis Stretch with Foot on Ground  - 2 x daily - 6 x weekly - 1 sets - 2 reps - 30 hold - Supine Lower Trunk Rotation  - 2 x daily - 6 x weekly - 1 sets - 10 reps - 5 sec hold - Supine Bridge  - 2 x daily - 6 x weekly - 1-2 sets - 10 reps - 5 hold - Quadruped Cat Cow  - 2 x daily - 6 x weekly - 1-2 sets - 10 reps - 5 sec hold - Child's Pose Stretch  - 2 x daily - 6 x weekly - 1 sets - 3 reps - 30 hold - Seated Lumbar Flexion Stretch  - 2 x daily - 6 x weekly - 1 sets - 10 reps - 5 sec hold  ASSESSMENT:  CLINICAL IMPRESSION: Patient referred to PT for chronic low back pain with foraminal stenosis and radiculopathy. She has good lumbar ROM but does lack some strength in Right leg compared to left. She had increased pain with repeated extension and felt better with repeated flexion so we will focus on flexion stretching to start with. I did try  DN today combined with Estim and she does express some relief immediately after.  Patient will benefit from skilled PT to  address below impairments, limitations and improve overall function.  OBJECTIVE IMPAIRMENTS: decreased activity tolerance, difficulty walking,  decreased strength, and pain.  ACTIVITY LIMITATIONS: bending, lifting, carry, locomotion, cleaning, community activity, driving, and or occupation  PERSONAL FACTORS: chronic pain are also affecting patient's functional outcome.  REHAB POTENTIAL: Good  CLINICAL DECISION MAKING: Stable/uncomplicated  EVALUATION COMPLEXITY: Low    GOALS: Short term PT Goals Target date: 10/30/2023   Pt will be I and compliant with HEP. Baseline:  Goal status: New Pt will decrease pain by 25% overall Baseline:7/10 Goal status: New  Long term PT goals Target date:11/27/2023   Pt will improve  Rt hip/knee strength to at least 5-/5 MMT to improve functional strength Baseline: Goal status: New Pt will improve FOTO to at least 60% functional to show improved function Baseline: Goal status: New Pt will reduce pain to overall less than 2-3/10 with usual activity and work activity. Baseline: Goal status: New Pt will be able to ambulate community distances at least 1000 ft WNL gait pattern without complaints Baseline: Goal status: New  PLAN: PT FREQUENCY: 1-2 times per week   PT DURATION: 4-8 weeks  PLANNED INTERVENTIONS (unless contraindicated): aquatic PT, Canalith repositioning, cryotherapy, Electrical stimulation, Iontophoresis with 4 mg/ml dexamethasome, Moist heat, traction, Ultrasound, gait training, Therapeutic exercise, balance training, neuromuscular re-education, patient/family education, manual techniques, passive ROM, dry needling, taping, vestibular, spinal manipulations, joint manipulations 97110-Therapeutic exercises, 97530- Therapeutic activity, [id number]- Neuromuscular re-education, 97535- Self Care, and [id number]- Manual therapy  PLAN FOR NEXT SESSION: how was DN with estim and repeat if desired. Flexion based program to start, possible traction  if symptoms improve, had pain with long axis distraction first day so this was held.   April Manson, PT,DPT 10/02/2023, 3:26 PM

## 2023-10-02 ENCOUNTER — Ambulatory Visit (INDEPENDENT_AMBULATORY_CARE_PROVIDER_SITE_OTHER): Payer: BC Managed Care – PPO | Admitting: Physical Therapy

## 2023-10-02 ENCOUNTER — Encounter: Payer: Self-pay | Admitting: Physical Therapy

## 2023-10-02 ENCOUNTER — Other Ambulatory Visit: Payer: Self-pay

## 2023-10-02 DIAGNOSIS — M5459 Other low back pain: Secondary | ICD-10-CM | POA: Diagnosis not present

## 2023-10-02 DIAGNOSIS — M6281 Muscle weakness (generalized): Secondary | ICD-10-CM | POA: Diagnosis not present

## 2023-10-03 NOTE — Addendum Note (Signed)
Addended by: April Manson on: 10/03/2023 03:28 PM   Modules accepted: Orders

## 2023-10-16 ENCOUNTER — Ambulatory Visit (INDEPENDENT_AMBULATORY_CARE_PROVIDER_SITE_OTHER): Payer: BC Managed Care – PPO | Admitting: Physical Therapy

## 2023-10-16 ENCOUNTER — Encounter: Payer: Self-pay | Admitting: Physical Therapy

## 2023-10-16 DIAGNOSIS — M6281 Muscle weakness (generalized): Secondary | ICD-10-CM | POA: Diagnosis not present

## 2023-10-16 DIAGNOSIS — M5459 Other low back pain: Secondary | ICD-10-CM | POA: Diagnosis not present

## 2023-10-16 NOTE — Therapy (Signed)
OUTPATIENT PHYSICAL THERAPY TREATMENT   Patient Name: Jamie Olson MRN: [id number] DOB:1976-05-02, 47 y.o., female Today's Date: 10/16/2023  END OF SESSION:  PT End of Session - 10/16/23 1514     Visit Number 2    Number of Visits 12    Date for PT Re-Evaluation 11/27/23    PT Start Time 1513    PT Stop Time 1552    PT Time Calculation (min) 39 min    Activity Tolerance Patient tolerated treatment well    Behavior During Therapy [facility] for tasks assessed/performed              Past Medical History:  Diagnosis Date   Anxiety    History reviewed. No pertinent surgical history. Patient Active Problem List   Diagnosis Date Noted   MDD (major depressive disorder), single episode, severe , no psychosis (HCC) 05/30/2023    PCP: none   REFERRING PROVIDER: Madaline Brilliant, DO   REFERRING DIAG: M54.9 (ICD-10-CM) - Back pain   Rationale for Evaluation and Treatment: Rehabilitation  THERAPY DIAG:  Muscle weakness (generalized)  Other low back pain  ONSET DATE: chronic pain > 1 year  SUBJECTIVE:                                                                                                                                                                                           SUBJECTIVE STATEMENT: Pain has been "okay" lately.  DN was helpful for about 2-3 days and now pain is about the same as it was  PERTINENT HISTORY:  Depression, anx, chronic back pain  PAIN:  NPRS scale: 5-6/10 upon arrival Pain location:low back and down legs, can switch sides on which leg it goes down Pain description: constant, tingling and tightness Aggravating factors: walking too much, bending, standing too much Relieving factors: child pose stretch, sometimes heat   PRECAUTIONS: None  RED FLAGS: Bowel or bladder incontinence: No   WEIGHT BEARING RESTRICTIONS: No  FALLS:  Has patient fallen in last 6 months? No   OCCUPATION: warehouse work  PLOF:  Independent  PATIENT GOALS: reduce pain  NEXT MD VISIT: 11/25/23  OBJECTIVE:  Note: Objective measures were completed at Evaluation unless otherwise noted.  DIAGNOSTIC FINDINGS:  Per MD note 09/16/23 MRI showing "moderate to severe right foraminal narrowing L4-L5 and widening of the L4-L5 facet joints bilaterally."  PATIENT SURVEYS:  Eval: FOTO 47% functional, goal is 60%   COGNITION: Overall cognitive status: Within functional limits for tasks assessed     SENSATION: WFL   PALPATION: Tender and painful in bilat lumbar P.S, QL, pirformis, superior glutes.  LUMBAR ROM:  AROM eval  Flexion WNL but pain in standing, did feel better with repeated flexion in sitting X10  Extension WNL but more pain after repeated extensions X 10  Right lateral flexion WNL but pain  Left lateral flexion WNL but pain  Right rotation WNL but pain  Left rotation WNL but pain   (Blank rows = not tested)  LOWER EXTREMITY ROM:   WNL bilat    LOWER EXTREMITY MMT:    MMT in sitting Right eval Left eval  Hip flexion 4 5  Hip extension    Hip abduction 4 5  Hip adduction    Hip internal rotation    Hip external rotation    Knee flexion 4 5  Knee extension 5 5  Ankle dorsiflexion 4 4  Ankle plantarflexion    Ankle inversion    Ankle eversion     (Blank rows = not tested)  LUMBAR SPECIAL TESTS:  Straight leg raise test: Positive and Slump test: Positive  Pain with long axis distraction  GAIT: Eval Comments: WNL but painful with prolonged walking.   TODAY'S TREATMENT:  10/16/23 TherEx NuStep L5 x 6 min Cat/cow x 5 reps each direction Childs pose 3x10 sec hold Single knee to chest 2x30 sec bil Lower trunk rotation 10 x 5 sec bil Bridges x10 reps; 5 sec hold Seated flexion stretch 2x10 sec hold bil  Manual STM with compression to Rt lumbar paraspinals and QL; Gr 1 CPA mobs to L4/5; skilled palpation and monitoring of soft tissue during DN Trigger Point Dry-Needling   Treatment instructions: Expect mild to moderate muscle soreness. S/S of pneumothorax if dry needled over a lung field, and to seek immediate medical attention should they occur. Patient verbalized understanding of these instructions and education.  Patient Consent Given: Yes Education handout provided: Yes Muscles treated: L4/5 multifidi, Rt QL Electrical stimulation performed: Yes Parameters:  10 mA freq with intensity to tolerance x 5 min Treatment response/outcome: twitch responses with decreased pain  10/02/23 HEP creation and review with demonstration and trial set preformed, see below for details  Manual therapy performed by [name], PT,DPT for active compression and skilled palpation and Trigger Point Dry-Needling  Treatment instructions: Expect mild to moderate muscle soreness. Patient Consent Given: Yes Education handout provided: verbally provided Muscles treated: bilat Lumbar paraspinals, QL and multifidi, superior glutes Estim combined: Yes milli amp current at frequency 10 to level tolerated Treatment response/outcome: good overall tolerance,twitch response noted      PATIENT EDUCATION: Education details: HEP, PT plan of care, DN  Person educated: Patient Education method: Explanation, Demonstration, Verbal cues, and Handouts Education comprehension: verbalized understanding and needs further education   HOME EXERCISE PROGRAM: Access Code: [email or web address] URL: [email or web address] Date: 10/02/2023 Prepared by: [name]  Exercises - Hooklying Single Knee to Chest Stretch  - 2 x daily - 6 x weekly - 1 sets - 2 reps - 30 sec hold - Supine Piriformis Stretch with Foot on Ground  - 2 x daily - 6 x weekly - 1 sets - 2 reps - 30 hold - Supine Lower Trunk Rotation  - 2 x daily - 6 x weekly - 1 sets - 10 reps - 5 sec hold - Supine Bridge  - 2 x daily - 6 x weekly - 1-2 sets - 10 reps - 5 hold - Quadruped Cat Cow  - 2 x daily - 6 x weekly - 1-2 sets - 10  reps - 5 sec hold - Child's Pose Stretch  -  2 x daily - 6 x weekly - 1 sets - 3 reps - 30 hold - Seated Lumbar Flexion Stretch  - 2 x daily - 6 x weekly - 1 sets - 10 reps - 5 sec hold  ASSESSMENT:  CLINICAL IMPRESSION:  Pt tolerated session well today with good initial response to manual therapy and DN with estim.  Repeated today with review of HEP needing mod cues for technique.  All goals ongoing at this time.     OBJECTIVE IMPAIRMENTS: decreased activity tolerance, difficulty walking,  decreased strength, and pain.  ACTIVITY LIMITATIONS: bending, lifting, carry, locomotion, cleaning, community activity, driving, and or occupation  PERSONAL FACTORS: chronic pain are also affecting patient's functional outcome.  REHAB POTENTIAL: Good  CLINICAL DECISION MAKING: Stable/uncomplicated  EVALUATION COMPLEXITY: Low    GOALS: Short term PT Goals Target date: 10/30/2023   Pt will be I and compliant with HEP. Baseline:  Goal status: New Pt will decrease pain by 25% overall Baseline:7/10 Goal status: New  Long term PT goals Target date:11/27/2023   Pt will improve  Rt hip/knee strength to at least 5-/5 MMT to improve functional strength Baseline: Goal status: New Pt will improve FOTO to at least 60% functional to show improved function Baseline: Goal status: New Pt will reduce pain to overall less than 2-3/10 with usual activity and work activity. Baseline: Goal status: New Pt will be able to ambulate community distances at least 1000 ft WNL gait pattern without complaints Baseline: Goal status: New  PLAN: PT FREQUENCY: 1-2 times per week   PT DURATION: 4-8 weeks  PLANNED INTERVENTIONS (unless contraindicated): aquatic PT, Canalith repositioning, cryotherapy, Electrical stimulation, Iontophoresis with 4 mg/ml dexamethasome, Moist heat, traction, Ultrasound, gait training, Therapeutic exercise, balance training, neuromuscular re-education, patient/family education, manual  techniques, passive ROM, dry needling, taping, vestibular, spinal manipulations, joint manipulations 97110-Therapeutic exercises, 97530- Therapeutic activity, 97112- Neuromuscular re-education, 97535- Self Care, and 95621- Manual therapy  PLAN FOR NEXT SESSION: assess response to DN with estim and repeat if desired. Flexion based program to start, possible traction if symptoms improve, had pain with long axis distraction first day so this was held.  Needs hip/core strengthening   Moshe Cipro, PT,DPT 10/16/2023, 3:56 PM

## 2023-10-17 ENCOUNTER — Encounter: Payer: BC Managed Care – PPO | Admitting: Physical Therapy

## 2023-10-21 ENCOUNTER — Encounter: Payer: BC Managed Care – PPO | Admitting: Physical Therapy

## 2023-10-28 ENCOUNTER — Ambulatory Visit (INDEPENDENT_AMBULATORY_CARE_PROVIDER_SITE_OTHER): Payer: BC Managed Care – PPO | Admitting: Physical Therapy

## 2023-10-28 ENCOUNTER — Encounter: Payer: Self-pay | Admitting: Physical Therapy

## 2023-10-28 DIAGNOSIS — M5459 Other low back pain: Secondary | ICD-10-CM

## 2023-10-28 DIAGNOSIS — M6281 Muscle weakness (generalized): Secondary | ICD-10-CM

## 2023-10-28 NOTE — Therapy (Signed)
OUTPATIENT PHYSICAL THERAPY TREATMENT   Patient Name: Jamie Olson MRN: [id number] DOB:Oct 13, 1976, 47 y.o., female Today's Date: 10/28/2023  END OF SESSION:  PT End of Session - 10/28/23 1540     Visit Number 3    Number of Visits 12    Date for PT Re-Evaluation 11/27/23    PT Start Time 1532    PT Stop Time 1610    PT Time Calculation (min) 38 min    Activity Tolerance Patient tolerated treatment well    Behavior During Therapy [facility] for tasks assessed/performed               Past Medical History:  Diagnosis Date   Anxiety    History reviewed. No pertinent surgical history. Patient Active Problem List   Diagnosis Date Noted   MDD (major depressive disorder), single episode, severe , no psychosis (HCC) 05/30/2023    PCP: none   REFERRING PROVIDER: Madaline Brilliant, DO   REFERRING DIAG: M54.9 (ICD-10-CM) - Back pain   Rationale for Evaluation and Treatment: Rehabilitation  THERAPY DIAG:  Muscle weakness (generalized)  Other low back pain  ONSET DATE: chronic pain > 1 year  SUBJECTIVE:                                                                                                                                                                                           SUBJECTIVE STATEMENT: Pain doing about the same, the DN helps some, is interested in trying traction today instead  PERTINENT HISTORY:  Depression, anx, chronic back pain  PAIN:  NPRS scale: 5-6/10 upon arrival Pain location:low back and down legs, can switch sides on which leg it goes down Pain description: constant, tingling and tightness Aggravating factors: walking too much, bending, standing too much Relieving factors: child pose stretch, sometimes heat   PRECAUTIONS: None  RED FLAGS: Bowel or bladder incontinence: No   WEIGHT BEARING RESTRICTIONS: No  FALLS:  Has patient fallen in last 6 months? No   OCCUPATION: warehouse work  PLOF: Independent  PATIENT GOALS:  reduce pain  NEXT MD VISIT: 11/25/23  OBJECTIVE:  Note: Objective measures were completed at Evaluation unless otherwise noted.  DIAGNOSTIC FINDINGS:  Per MD note 09/16/23 MRI showing "moderate to severe right foraminal narrowing L4-L5 and widening of the L4-L5 facet joints bilaterally."  PATIENT SURVEYS:  Eval: FOTO 47% functional, goal is 60%   COGNITION: Overall cognitive status: Within functional limits for tasks assessed     SENSATION: WFL   PALPATION: Tender and painful in bilat lumbar P.S, QL, pirformis, superior glutes.  LUMBAR ROM:   AROM eval  Flexion WNL but  pain in standing, did feel better with repeated flexion in sitting X10  Extension WNL but more pain after repeated extensions X 10  Right lateral flexion WNL but pain  Left lateral flexion WNL but pain  Right rotation WNL but pain  Left rotation WNL but pain   (Blank rows = not tested)  LOWER EXTREMITY ROM:   WNL bilat    LOWER EXTREMITY MMT:    MMT in sitting Right eval Left eval  Hip flexion 4 5  Hip extension    Hip abduction 4 5  Hip adduction    Hip internal rotation    Hip external rotation    Knee flexion 4 5  Knee extension 5 5  Ankle dorsiflexion 4 4  Ankle plantarflexion    Ankle inversion    Ankle eversion     (Blank rows = not tested)  LUMBAR SPECIAL TESTS:  Straight leg raise test: Positive and Slump test: Positive  Pain with long axis distraction  GAIT: Eval Comments: WNL but painful with prolonged walking.   TODAY'S TREATMENT:  10/28/23 Mechanical lumbar traction 80-60# intermittent pull X 15 min Seated flexion stretch 10x5 sec hold bil Seated ab set with pball isometric shoulder ext 5 sec X 10 and isometric hip flexion 5 sec X 10 Seated lumbar extension isometric pushing into yellow ball 5 sec  X10 Standing unilateral rows into rotation green X 15 each side Standing L stretch at counter 5 sec X10  10/16/23 TherEx NuStep L5 x 6 min Cat/cow x 5 reps each  direction Childs pose 3x10 sec hold Single knee to chest 2x30 sec bil Lower trunk rotation 10 x 5 sec bil Bridges x10 reps; 5 sec hold Seated flexion stretch 2x10 sec hold bil  Manual STM with compression to Rt lumbar paraspinals and QL; Gr 1 CPA mobs to L4/5; skilled palpation and monitoring of soft tissue during DN Trigger Point Dry-Needling  Treatment instructions: Expect mild to moderate muscle soreness. S/S of pneumothorax if dry needled over a lung field, and to seek immediate medical attention should they occur. Patient verbalized understanding of these instructions and education.  Patient Consent Given: Yes Education handout provided: Yes Muscles treated: L4/5 multifidi, Rt QL Electrical stimulation performed: Yes Parameters:  10 mA freq with intensity to tolerance x 5 min Treatment response/outcome: twitch responses with decreased pain  10/02/23 HEP creation and review with demonstration and trial set preformed, see below for details  Manual therapy performed by [name], PT,DPT for active compression and skilled palpation and Trigger Point Dry-Needling  Treatment instructions: Expect mild to moderate muscle soreness. Patient Consent Given: Yes Education handout provided: verbally provided Muscles treated: bilat Lumbar paraspinals, QL and multifidi, superior glutes Estim combined: Yes milli amp current at frequency 10 to level tolerated Treatment response/outcome: good overall tolerance,twitch response noted      PATIENT EDUCATION: Education details: HEP, PT plan of care, DN  Person educated: Patient Education method: Explanation, Demonstration, Verbal cues, and Handouts Education comprehension: verbalized understanding and needs further education   HOME EXERCISE PROGRAM: Access Code: [email or web address] URL: [email or web address] Date: 10/02/2023 Prepared by: [name]  Exercises - Hooklying Single Knee to Chest Stretch  - 2 x daily - 6 x weekly - 1  sets - 2 reps - 30 sec hold - Supine Piriformis Stretch with Foot on Ground  - 2 x daily - 6 x weekly - 1 sets - 2 reps - 30 hold - Supine Lower Trunk Rotation  - 2 x daily -  6 x weekly - 1 sets - 10 reps - 5 sec hold - Supine Bridge  - 2 x daily - 6 x weekly - 1-2 sets - 10 reps - 5 hold - Quadruped Cat Cow  - 2 x daily - 6 x weekly - 1-2 sets - 10 reps - 5 sec hold - Child's Pose Stretch  - 2 x daily - 6 x weekly - 1 sets - 3 reps - 30 hold - Seated Lumbar Flexion Stretch  - 2 x daily - 6 x weekly - 1 sets - 10 reps - 5 sec hold  ASSESSMENT:  CLINICAL IMPRESSION:  Trial of mechanical lumbar traction today to see if this helps with her pain any more than the DN has done. This was followed but with stretching and core stability work. We will assess her longer term response to traction next visit.    OBJECTIVE IMPAIRMENTS: decreased activity tolerance, difficulty walking,  decreased strength, and pain.  ACTIVITY LIMITATIONS: bending, lifting, carry, locomotion, cleaning, community activity, driving, and or occupation  PERSONAL FACTORS: chronic pain are also affecting patient's functional outcome.  REHAB POTENTIAL: Good  CLINICAL DECISION MAKING: Stable/uncomplicated  EVALUATION COMPLEXITY: Low    GOALS: Short term PT Goals Target date: 10/30/2023   Pt will be I and compliant with HEP. Baseline:  Goal status: MET 10/28/23 Pt will decrease pain by 25% overall Baseline:7/10 Goal status: ongoing 10/28/23  Long term PT goals Target date:11/27/2023   Pt will improve  Rt hip/knee strength to at least 5-/5 MMT to improve functional strength Baseline: Goal status: ongoing 10/28/23 Pt will improve FOTO to at least 60% functional to show improved function Baseline: Goal status: ongoing 10/28/23 Pt will reduce pain to overall less than 2-3/10 with usual activity and work activity. Baseline: Goal status: ongoing 10/28/23 Pt will be able to ambulate community distances at least 1000  ft WNL gait pattern without complaints Baseline: Goal status: ongoing 10/28/23  PLAN: PT FREQUENCY: 1-2 times per week   PT DURATION: 4-8 weeks  PLANNED INTERVENTIONS (unless contraindicated): aquatic PT, Canalith repositioning, cryotherapy, Electrical stimulation, Iontophoresis with 4 mg/ml dexamethasome, Moist heat, traction, Ultrasound, gait training, Therapeutic exercise, balance training, neuromuscular re-education, patient/family education, manual techniques, passive ROM, dry needling, taping, vestibular, spinal manipulations, joint manipulations [id number]-Therapeutic exercises, 97530- Therapeutic activity, 97112- Neuromuscular re-education, 97535- Self Care, and [id number]- Manual therapy  PLAN FOR NEXT SESSION: assess response to traction last time. Continue with stretching and strengthening exercises with gradual progressions as tolerated.    April Manson, PT,DPT 10/28/2023, 4:08 PM

## 2023-10-31 ENCOUNTER — Ambulatory Visit (INDEPENDENT_AMBULATORY_CARE_PROVIDER_SITE_OTHER): Payer: BC Managed Care – PPO | Admitting: Physical Therapy

## 2023-10-31 ENCOUNTER — Encounter: Payer: Self-pay | Admitting: Physical Therapy

## 2023-10-31 DIAGNOSIS — M5459 Other low back pain: Secondary | ICD-10-CM

## 2023-10-31 DIAGNOSIS — M6281 Muscle weakness (generalized): Secondary | ICD-10-CM

## 2023-10-31 NOTE — Therapy (Signed)
 OUTPATIENT PHYSICAL THERAPY TREATMENT   Patient Name: Jamie Olson MRN: [id number] DOB:1976-09-13, 48 y.o., female Today's Date: 10/31/2023  END OF SESSION:  PT End of Session - 10/31/23 1517     Visit Number 4    Number of Visits 12    Date for PT Re-Evaluation 11/27/23    PT Start Time 1517    PT Stop Time 1553    PT Time Calculation (min) 36 min    Activity Tolerance Patient tolerated treatment well    Behavior During Therapy [facility] for tasks assessed/performed                Past Medical History:  Diagnosis Date   Anxiety    History reviewed. No pertinent surgical history. Patient Active Problem List   Diagnosis Date Noted   MDD (major depressive disorder), single episode, severe , no psychosis (HCC) 05/30/2023    PCP: none   REFERRING PROVIDER: Delice Evalene LABOR, DO   REFERRING DIAG: M54.9 (ICD-10-CM) - Back pain   Rationale for Evaluation and Treatment: Rehabilitation  THERAPY DIAG:  Muscle weakness (generalized)  Other low back pain  ONSET DATE: chronic pain > 1 year  SUBJECTIVE:                                                                                                                                                                                           SUBJECTIVE STATEMENT: Pain is a little better; feels like the traction was helpful; stretching is helpful  PERTINENT HISTORY:  Depression, anx, chronic back pain  PAIN:  NPRS scale: 5-6/10 upon arrival Pain location:low back and down legs, can switch sides on which leg it goes down Pain description: constant, tingling and tightness Aggravating factors: walking too much, bending, standing too much Relieving factors: child pose stretch, sometimes heat   PRECAUTIONS: None  RED FLAGS: Bowel or bladder incontinence: No   WEIGHT BEARING RESTRICTIONS: No  FALLS:  Has patient fallen in last 6 months? No   OCCUPATION: warehouse work  PLOF: Independent  PATIENT GOALS: reduce  pain  NEXT MD VISIT: 11/25/23  OBJECTIVE:  Note: Objective measures were completed at Evaluation unless otherwise noted.  DIAGNOSTIC FINDINGS:  Per MD note 09/16/23 MRI showing moderate to severe right foraminal narrowing L4-L5 and widening of the L4-L5 facet joints bilaterally.  PATIENT SURVEYS:  Eval: FOTO 47% functional, goal is 60%   COGNITION: Overall cognitive status: Within functional limits for tasks assessed     SENSATION: WFL   PALPATION: Tender and painful in bilat lumbar P.S, QL, pirformis, superior glutes.  LUMBAR ROM:   AROM eval  Flexion WNL but pain  in standing, did feel better with repeated flexion in sitting X10  Extension WNL but more pain after repeated extensions X 10  Right lateral flexion WNL but pain  Left lateral flexion WNL but pain  Right rotation WNL but pain  Left rotation WNL but pain   (Blank rows = not tested)  LOWER EXTREMITY ROM:   WNL bilat    LOWER EXTREMITY MMT:    MMT in sitting Right eval Left eval  Hip flexion 4 5  Hip extension    Hip abduction 4 5  Hip adduction    Hip internal rotation    Hip external rotation    Knee flexion 4 5  Knee extension 5 5  Ankle dorsiflexion 4 4  Ankle plantarflexion    Ankle inversion    Ankle eversion     (Blank rows = not tested)  LUMBAR SPECIAL TESTS:  Straight leg raise test: Positive and Slump test: Positive  Pain with long axis distraction  GAIT: Eval Comments: WNL but painful with prolonged walking.   TODAY'S TREATMENT:  10/31/23 Modalities Mechanical lumbar traction 80-60# intermittent pull X 15 min  TherEx Standing L stretch at counter 5 sec X10 Alternating single arm row L4 band x 10 reps bil Discussed continuation of stretches/exercises and current progress.  Pt verbalized understanding.  10/28/23 Mechanical lumbar traction 80-60# intermittent pull X 15 min Seated flexion stretch 10x5 sec hold bil Seated ab set with pball isometric shoulder ext 5 sec X 10  and isometric hip flexion 5 sec X 10 Seated lumbar extension isometric pushing into yellow ball 5 sec  X10 Standing unilateral rows into rotation green X 15 each side Standing L stretch at counter 5 sec X10  10/16/23 TherEx NuStep L5 x 6 min Cat/cow x 5 reps each direction Childs pose 3x10 sec hold Single knee to chest 2x30 sec bil Lower trunk rotation 10 x 5 sec bil Bridges x10 reps; 5 sec hold Seated flexion stretch 2x10 sec hold bil  Manual STM with compression to Rt lumbar paraspinals and QL; Gr 1 CPA mobs to L4/5; skilled palpation and monitoring of soft tissue during DN Trigger Point Dry-Needling  Treatment instructions: Expect mild to moderate muscle soreness. S/S of pneumothorax if dry needled over a lung field, and to seek immediate medical attention should they occur. Patient verbalized understanding of these instructions and education.  Patient Consent Given: Yes Education handout provided: Yes Muscles treated: L4/5 multifidi, Rt QL Electrical stimulation performed: Yes Parameters:  10 mA freq with intensity to tolerance x 5 min Treatment response/outcome: twitch responses with decreased pain  10/02/23 HEP creation and review with demonstration and trial set preformed, see below for details  Manual therapy performed by Redell Moose, PT,DPT for active compression and skilled palpation and Trigger Point Dry-Needling  Treatment instructions: Expect mild to moderate muscle soreness. Patient Consent Given: Yes Education handout provided: verbally provided Muscles treated: bilat Lumbar paraspinals, QL and multifidi, superior glutes Estim combined: Yes milli amp current at frequency 10 to level tolerated Treatment response/outcome: good overall tolerance,twitch response noted      PATIENT EDUCATION: Education details: HEP, PT plan of care, DN  Person educated: Patient Education method: Explanation, Demonstration, Verbal cues, and Handouts Education comprehension:  verbalized understanding and needs further education   HOME EXERCISE PROGRAM: Access Code: [email or web address] URL: [email or web address] Date: 10/02/2023 Prepared by: Redell Moose  Exercises - Hooklying Single Knee to Chest Stretch  - 2 x daily - 6 x weekly - 1 sets - 2  reps - 30 sec hold - Supine Piriformis Stretch with Foot on Ground  - 2 x daily - 6 x weekly - 1 sets - 2 reps - 30 hold - Supine Lower Trunk Rotation  - 2 x daily - 6 x weekly - 1 sets - 10 reps - 5 sec hold - Supine Bridge  - 2 x daily - 6 x weekly - 1-2 sets - 10 reps - 5 hold - Quadruped Cat Cow  - 2 x daily - 6 x weekly - 1-2 sets - 10 reps - 5 sec hold - Child's Pose Stretch  - 2 x daily - 6 x weekly - 1 sets - 3 reps - 30 hold - Seated Lumbar Flexion Stretch  - 2 x daily - 6 x weekly - 1 sets - 10 reps - 5 sec hold  ASSESSMENT:  CLINICAL IMPRESSION:  Pt reporting pain is about the same after traction today but felt better last session.  She's getting short term relief with PT but reports pain returns within 24 hours.  Discussed holding v/s continuation as well today.   OBJECTIVE IMPAIRMENTS: decreased activity tolerance, difficulty walking,  decreased strength, and pain.  ACTIVITY LIMITATIONS: bending, lifting, carry, locomotion, cleaning, community activity, driving, and or occupation  PERSONAL FACTORS: chronic pain are also affecting patient's functional outcome.  REHAB POTENTIAL: Good  CLINICAL DECISION MAKING: Stable/uncomplicated  EVALUATION COMPLEXITY: Low    GOALS: Short term PT Goals Target date: 10/30/2023   Pt will be I and compliant with HEP. Baseline:  Goal status: MET 10/28/23 Pt will decrease pain by 25% overall Baseline:7/10 Goal status: ongoing 10/28/23  Long term PT goals Target date:11/27/2023   Pt will improve  Rt hip/knee strength to at least 5-/5 MMT to improve functional strength Baseline: Goal status: ongoing 10/28/23 Pt will improve FOTO to at least 60% functional  to show improved function Baseline: Goal status: ongoing 10/28/23 Pt will reduce pain to overall less than 2-3/10 with usual activity and work activity. Baseline: Goal status: ongoing 10/28/23 Pt will be able to ambulate community distances at least 1000 ft WNL gait pattern without complaints Baseline: Goal status: ongoing 10/28/23  PLAN: PT FREQUENCY: 1-2 times per week   PT DURATION: 4-8 weeks  PLANNED INTERVENTIONS (unless contraindicated): aquatic PT, Canalith repositioning, cryotherapy, Electrical stimulation, Iontophoresis with 4 mg/ml dexamethasome, Moist heat, traction, Ultrasound, gait training, Therapeutic exercise, balance training, neuromuscular re-education, patient/family education, manual techniques, passive ROM, dry needling, taping, vestibular, spinal manipulations, joint manipulations [id number]-Therapeutic exercises, 97530- Therapeutic activity, 97112- Neuromuscular re-education, 97535- Self Care, and [id number]- Manual therapy  PLAN FOR NEXT SESSION: see how traction was, continue v/s holding PT,  Continue with stretching and strengthening exercises with gradual progressions as tolerated.    Corean JULIANNA Ku, PT, DPT 10/31/23 3:58 PM

## 2023-11-04 ENCOUNTER — Encounter: Payer: Self-pay | Admitting: Physical Therapy

## 2023-11-04 ENCOUNTER — Ambulatory Visit (INDEPENDENT_AMBULATORY_CARE_PROVIDER_SITE_OTHER): Payer: BC Managed Care – PPO | Admitting: Physical Therapy

## 2023-11-04 DIAGNOSIS — M5459 Other low back pain: Secondary | ICD-10-CM | POA: Diagnosis not present

## 2023-11-04 DIAGNOSIS — M6281 Muscle weakness (generalized): Secondary | ICD-10-CM

## 2023-11-04 NOTE — Therapy (Signed)
 OUTPATIENT PHYSICAL THERAPY TREATMENT   Patient Name: Jamie Olson MRN: [id number] DOB:[birth year], 48 y.o., female Today's Date: 11/04/2023  END OF SESSION:  PT End of Session - 11/04/23 1532     Visit Number 5    Number of Visits 12    Date for PT Re-Evaluation 11/27/23    PT Start Time 1524    PT Stop Time 1600    PT Time Calculation (min) 36 min    Activity Tolerance Patient tolerated treatment well    Behavior During Therapy [facility] for tasks assessed/performed                 Past Medical History:  Diagnosis Date   Anxiety    History reviewed. No pertinent surgical history. Patient Active Problem List   Diagnosis Date Noted   MDD (major depressive disorder), single episode, severe , no psychosis (HCC) 05/30/2023    PCP: none   REFERRING PROVIDER: Delice Evalene LABOR, DO   REFERRING DIAG: M54.9 (ICD-10-CM) - Back pain   Rationale for Evaluation and Treatment: Rehabilitation  THERAPY DIAG:  Other low back pain  Muscle weakness (generalized)  ONSET DATE: chronic pain > 1 year  SUBJECTIVE:                                                                                                                                                                                           SUBJECTIVE STATEMENT: Back feels a little better she is not having the tingling down her leg like she was when she first started PT.  PERTINENT HISTORY:  Depression, anx, chronic back pain  PAIN:  NPRS scale: 3-4/10 upon arrival Pain location:low back and down legs, can switch sides on which leg it goes down Pain description: constant, tingling and tightness Aggravating factors: walking too much, bending, standing too much Relieving factors: child pose stretch, sometimes heat   PRECAUTIONS: None  RED FLAGS: Bowel or bladder incontinence: No   WEIGHT BEARING RESTRICTIONS: No  FALLS:  Has patient fallen in last 6 months? No   OCCUPATION: warehouse work  PLOF:  Independent  PATIENT GOALS: reduce pain  NEXT MD VISIT: 11/25/23  OBJECTIVE:  Note: Objective measures were completed at Evaluation unless otherwise noted.  DIAGNOSTIC FINDINGS:  Per MD note 09/16/23 MRI showing moderate to severe right foraminal narrowing L4-L5 and widening of the L4-L5 facet joints bilaterally.  PATIENT SURVEYS:  Eval: FOTO 47% functional, goal is 60% 11/04/23 :FOTO improved to 56%   COGNITION: Overall cognitive status: Within functional limits for tasks assessed     SENSATION: WFL   PALPATION: Tender and painful in bilat lumbar P.S, QL, pirformis,  superior glutes.  LUMBAR ROM:   AROM eval  Flexion WNL but pain in standing, did feel better with repeated flexion in sitting X10  Extension WNL but more pain after repeated extensions X 10  Right lateral flexion WNL but pain  Left lateral flexion WNL but pain  Right rotation WNL but pain  Left rotation WNL but pain   (Blank rows = not tested)  LOWER EXTREMITY ROM:   WNL bilat    LOWER EXTREMITY MMT:    MMT in sitting Right eval Left eval  Hip flexion 4 5  Hip extension    Hip abduction 4 5  Hip adduction    Hip internal rotation    Hip external rotation    Knee flexion 4 5  Knee extension 5 5  Ankle dorsiflexion 4 4  Ankle plantarflexion    Ankle inversion    Ankle eversion     (Blank rows = not tested)  LUMBAR SPECIAL TESTS:  Straight leg raise test: Positive and Slump test: Positive  Pain with long axis distraction  GAIT: Eval Comments: WNL but painful with prolonged walking.   TODAY'S TREATMENT:  11/04/23 Modalities Mechanical lumbar traction 80-60# intermittent pull X 15 min  TherEx Lumbar low trunk rotations 5 sec X10 Supine SKTC stretch 20 sec X 3 Standing L stretch 5 sec X10  10/31/23 Modalities Mechanical lumbar traction 80-60# intermittent pull X 15 min  TherEx Standing L stretch at counter 5 sec X10 Alternating single arm row L4 band x 10 reps bil Discussed  continuation of stretches/exercises and current progress.  Pt verbalized understanding.  10/28/23 Mechanical lumbar traction 80-60# intermittent pull X 15 min Seated flexion stretch 10x5 sec hold bil Seated ab set with pball isometric shoulder ext 5 sec X 10 and isometric hip flexion 5 sec X 10 Seated lumbar extension isometric pushing into yellow ball 5 sec  X10 Standing unilateral rows into rotation green X 15 each side Standing L stretch at counter 5 sec X10  10/16/23 TherEx NuStep L5 x 6 min Cat/cow x 5 reps each direction Childs pose 3x10 sec hold Single knee to chest 2x30 sec bil Lower trunk rotation 10 x 5 sec bil Bridges x10 reps; 5 sec hold Seated flexion stretch 2x10 sec hold bil  Manual STM with compression to Rt lumbar paraspinals and QL; Gr 1 CPA mobs to L4/5; skilled palpation and monitoring of soft tissue during DN Trigger Point Dry-Needling  Treatment instructions: Expect mild to moderate muscle soreness. S/S of pneumothorax if dry needled over a lung field, and to seek immediate medical attention should they occur. Patient verbalized understanding of these instructions and education.  Patient Consent Given: Yes Education handout provided: Yes Muscles treated: L4/5 multifidi, Rt QL Electrical stimulation performed: Yes Parameters:  10 mA freq with intensity to tolerance x 5 min Treatment response/outcome: twitch responses with decreased pain   PATIENT EDUCATION: Education details: HEP, PT plan of care, DN  Person educated: Patient Education method: Explanation, Demonstration, Verbal cues, and Handouts Education comprehension: verbalized understanding and needs further education   HOME EXERCISE PROGRAM: Access Code: [email or web address] URL: [email or web address] Date: 10/02/2023 Prepared by: Redell Moose  Exercises - Hooklying Single Knee to Chest Stretch  - 2 x daily - 6 x weekly - 1 sets - 2 reps - 30 sec hold - Supine Piriformis Stretch with Foot  on Ground  - 2 x daily - 6 x weekly - 1 sets - 2 reps - 30 hold - Supine Lower Trunk Rotation  -  2 x daily - 6 x weekly - 1 sets - 10 reps - 5 sec hold - Supine Bridge  - 2 x daily - 6 x weekly - 1-2 sets - 10 reps - 5 hold - Quadruped Cat Cow  - 2 x daily - 6 x weekly - 1-2 sets - 10 reps - 5 sec hold - Child's Pose Stretch  - 2 x daily - 6 x weekly - 1 sets - 3 reps - 30 hold - Seated Lumbar Flexion Stretch  - 2 x daily - 6 x weekly - 1 sets - 10 reps - 5 sec hold  ASSESSMENT:  CLINICAL IMPRESSION:  FOTO functional survey showed some improvement but not yet at the goal. Subjective improvements in pain lately and feels traction is helping so we did this again today with increased pull as well.   OBJECTIVE IMPAIRMENTS: decreased activity tolerance, difficulty walking,  decreased strength, and pain.  ACTIVITY LIMITATIONS: bending, lifting, carry, locomotion, cleaning, community activity, driving, and or occupation  PERSONAL FACTORS: chronic pain are also affecting patient's functional outcome.  REHAB POTENTIAL: Good  CLINICAL DECISION MAKING: Stable/uncomplicated  EVALUATION COMPLEXITY: Low    GOALS: Short term PT Goals Target date: 10/30/2023   Pt will be I and compliant with HEP. Baseline:  Goal status: MET 10/28/23 Pt will decrease pain by 25% overall Baseline:7/10 Goal status: ongoing 10/28/23  Long term PT goals Target date:11/27/2023   Pt will improve  Rt hip/knee strength to at least 5-/5 MMT to improve functional strength Baseline: Goal status: ongoing 10/28/23 Pt will improve FOTO to at least 60% functional to show improved function Baseline: Goal status: ongoing 10/28/23 Pt will reduce pain to overall less than 2-3/10 with usual activity and work activity. Baseline: Goal status: ongoing 10/28/23 Pt will be able to ambulate community distances at least 1000 ft WNL gait pattern without complaints Baseline: Goal status: ongoing 10/28/23  PLAN: PT FREQUENCY:  1-2 times per week   PT DURATION: 4-8 weeks  PLANNED INTERVENTIONS (unless contraindicated): aquatic PT, Canalith repositioning, cryotherapy, Electrical stimulation, Iontophoresis with 4 mg/ml dexamethasome, Moist heat, traction, Ultrasound, gait training, Therapeutic exercise, balance training, neuromuscular re-education, patient/family education, manual techniques, passive ROM, dry needling, taping, vestibular, spinal manipulations, joint manipulations [id number]-Therapeutic exercises, 97530- Therapeutic activity, 97112- Neuromuscular re-education, 97535- Self Care, and [id number]- Manual therapy  PLAN FOR NEXT SESSION: last visit scheduled so reasess to see if she is ready to finish up or if she needs more PT.  Redell Moose, PT, DPT 11/04/23 4:04 PM

## 2023-11-07 ENCOUNTER — Encounter: Payer: Self-pay | Admitting: Physical Therapy

## 2023-11-07 ENCOUNTER — Ambulatory Visit (INDEPENDENT_AMBULATORY_CARE_PROVIDER_SITE_OTHER): Payer: BLUE CROSS/BLUE SHIELD | Admitting: Physical Therapy

## 2023-11-07 DIAGNOSIS — M5459 Other low back pain: Secondary | ICD-10-CM | POA: Diagnosis not present

## 2023-11-07 DIAGNOSIS — M6281 Muscle weakness (generalized): Secondary | ICD-10-CM | POA: Diagnosis not present

## 2023-11-07 NOTE — Therapy (Addendum)
 OUTPATIENT PHYSICAL THERAPY TREATMENT/DISCHARGE PHYSICAL THERAPY DISCHARGE SUMMARY  Visits from Start of Care: 6  Current functional level related to goals / functional outcomes: See below   Remaining deficits: See below   Education / Equipment: HEP  Plan:  Patient goals were not met. Patient is being discharged due to not returning since last visit >30 days  [name], PT, DPT 02/05/24 3:47 PM     Patient Name: Jamie Olson MRN: [id number] DOB:16-Dec-1975, 48 y.o., female Today's Date: 11/07/2023  END OF SESSION:  PT End of Session - 11/07/23 1524     Visit Number 6    Number of Visits 12    Date for PT Re-Evaluation 11/27/23    PT Start Time 1514    PT Stop Time 1552    PT Time Calculation (min) 38 min    Activity Tolerance Patient tolerated treatment well    Behavior During Therapy [facility] for tasks assessed/performed                 Past Medical History:  Diagnosis Date   Anxiety    History reviewed. No pertinent surgical history. Patient Active Problem List   Diagnosis Date Noted   MDD (major depressive disorder), single episode, severe , no psychosis (HCC) 05/30/2023    PCP: none   REFERRING PROVIDER: Madaline Brilliant, DO   REFERRING DIAG: M54.9 (ICD-10-CM) - Back pain   Rationale for Evaluation and Treatment: Rehabilitation  THERAPY DIAG:  Other low back pain  Muscle weakness (generalized)  ONSET DATE: chronic pain > 1 year  SUBJECTIVE:                                                                                                                                                                                           SUBJECTIVE STATEMENT: Back feels a little better overall  PERTINENT HISTORY:  Depression, anx, chronic back pain  PAIN:  NPRS scale: 2-3/10 upon arrival Pain location:low back and down legs, can switch sides on which leg it goes down Pain description: constant, tingling and tightness Aggravating factors:  walking too much, bending, standing too much Relieving factors: child pose stretch, sometimes heat   PRECAUTIONS: None  RED FLAGS: Bowel or bladder incontinence: No   WEIGHT BEARING RESTRICTIONS: No  FALLS:  Has patient fallen in last 6 months? No   OCCUPATION: warehouse work  PLOF: Independent  PATIENT GOALS: reduce pain  NEXT MD VISIT: 11/25/23  OBJECTIVE:  Note: Objective measures were completed at Evaluation unless otherwise noted.  DIAGNOSTIC FINDINGS:  Per MD note 09/16/23 MRI showing "moderate to severe right foraminal narrowing L4-L5 and widening of the  L4-L5 facet joints bilaterally."  PATIENT SURVEYS:  Eval: FOTO 47% functional, goal is 60% 11/04/23 :FOTO improved to 56%   COGNITION: Overall cognitive status: Within functional limits for tasks assessed     SENSATION: WFL   PALPATION: Tender and painful in bilat lumbar P.S, QL, pirformis, superior glutes.  LUMBAR ROM:   AROM eval 49/9/24  Flexion WNL but pain in standing, did feel better with repeated flexion in sitting X10 WNL  Extension WNL but more pain after repeated extensions X 10 WNL  Right lateral flexion WNL but pain WNL  Left lateral flexion WNL but pain WNL  Right rotation WNL but pain WNL  Left rotation WNL but pain WNL   (Blank rows = not tested)  LOWER EXTREMITY ROM:   WNL bilat    LOWER EXTREMITY MMT:    MMT in sitting Right eval Left eval Right 49/9/24  Hip flexion 4 5 5   Hip extension     Hip abduction 4 5 5   Hip adduction     Hip internal rotation     Hip external rotation     Knee flexion 4 5 5   Knee extension 5 5 5   Ankle dorsiflexion 4 4 5   Ankle plantarflexion     Ankle inversion     Ankle eversion      (Blank rows = not tested)  LUMBAR SPECIAL TESTS:  Straight leg raise test: Positive and Slump test: Positive  Pain with long axis distraction  GAIT: Eval Comments: WNL but painful with prolonged walking.   TODAY'S TREATMENT:   11/07/23 Modalities Mechanical lumbar traction 80-60# intermittent pull X 15 min  TherEx Lumbar low trunk rotations 5 sec X10 Supine SKTC stretch 30 sec X 2 Standing L stretch 5 sec X10   11/04/23 Modalities Mechanical lumbar traction 80-60# intermittent pull X 15 min  TherEx Lumbar low trunk rotations 5 sec X10 Supine SKTC stretch 20 sec X 3 Standing L stretch 5 sec X10  10/31/23 Modalities Mechanical lumbar traction 80-60# intermittent pull X 15 min  TherEx Standing L stretch at counter 5 sec X10 Alternating single arm row L4 band x 10 reps bil Discussed continuation of stretches/exercises and current progress.  Pt verbalized understanding.  10/28/23 Mechanical lumbar traction 80-60# intermittent pull X 15 min Seated flexion stretch 10x5 sec hold bil Seated ab set with pball isometric shoulder ext 5 sec X 10 and isometric hip flexion 5 sec X 10 Seated lumbar extension isometric pushing into yellow ball 5 sec  X10 Standing unilateral rows into rotation green X 15 each side Standing L stretch at counter 5 sec X10   PATIENT EDUCATION: Education details: HEP, PT plan of care, DN  Person educated: Patient Education method: Explanation, Demonstration, Verbal cues, and Handouts Education comprehension: verbalized understanding and needs further education   HOME EXERCISE PROGRAM: Access Code: [email or web address] URL: [email or web address] Date: 10/02/2023 Prepared by: [name]  Exercises - Hooklying Single Knee to Chest Stretch  - 2 x daily - 6 x weekly - 1 sets - 2 reps - 30 sec hold - Supine Piriformis Stretch with Foot on Ground  - 2 x daily - 6 x weekly - 1 sets - 2 reps - 30 hold - Supine Lower Trunk Rotation  - 2 x daily - 6 x weekly - 1 sets - 10 reps - 5 sec hold - Supine Bridge  - 2 x daily - 6 x weekly - 1-2 sets - 10 reps - 5 hold - Quadruped Cat Cow  -  2 x daily - 6 x weekly - 1-2 sets - 10 reps - 5 sec hold - Child's Pose Stretch  - 2 x daily - 6 x  weekly - 1 sets - 3 reps - 30 hold - Seated Lumbar Flexion Stretch  - 2 x daily - 6 x weekly - 1 sets - 10 reps - 5 sec hold  ASSESSMENT:  CLINICAL IMPRESSION:  Continued with traction today with increased pull again as this appears to be giving her the best benefit. This was followed up with stretching and strengthening program to tolerance. She showed improve leg strength and function with updated testing. She would like to continue with PT so she is welcome to set up more visits.    OBJECTIVE IMPAIRMENTS: decreased activity tolerance, difficulty walking,  decreased strength, and pain.  ACTIVITY LIMITATIONS: bending, lifting, carry, locomotion, cleaning, community activity, driving, and or occupation  PERSONAL FACTORS: chronic pain are also affecting patient's functional outcome.  REHAB POTENTIAL: Good  CLINICAL DECISION MAKING: Stable/uncomplicated  EVALUATION COMPLEXITY: Low    GOALS: Short term PT Goals Target date: 10/30/2023   Pt will be I and compliant with HEP. Baseline:  Goal status: MET 10/28/23 Pt will decrease pain by 25% overall Baseline:7/10 Goal status: MET 11/06/22  Long term PT goals Target date:11/27/2023   Pt will improve  Rt hip/knee strength to at least 5-/5 MMT to improve functional strength Baseline: Goal status:MET 11/07/23 Pt will improve FOTO to at least 60% functional to show improved function Baseline: Goal status: ongoing 11/07/23 Pt will reduce pain to overall less than 2-3/10 with usual activity and work activity. Baseline: Goal status: ongoing 11/07/23 Pt will be able to ambulate community distances at least 1000 ft WNL gait pattern without complaints Baseline: Goal status: MET 11/07/23  PLAN: PT FREQUENCY: 1-2 times per week   PT DURATION: 4-8 weeks  PLANNED INTERVENTIONS (unless contraindicated): aquatic PT, Canalith repositioning, cryotherapy, Electrical stimulation, Iontophoresis with 4 mg/ml dexamethasome, Moist heat, traction,  Ultrasound, gait training, Therapeutic exercise, balance training, neuromuscular re-education, patient/family education, manual techniques, passive ROM, dry needling, taping, vestibular, spinal manipulations, joint manipulations 97110-Therapeutic exercises, 97530- Therapeutic activity, 97112- Neuromuscular re-education, 97535- Self Care, and [id number]- Manual therapy  PLAN FOR NEXT SESSION:traction if desired. Stretching and strengthening exercises as tolerated. [name], PT, DPT 11/07/23 3:25 PM
